# Patient Record
Sex: Female | Born: 1990
Health system: Southern US, Community
[De-identification: ages and names within clinical notes are randomized; demographics above are authoritative.]

## PROBLEM LIST (undated history)

## (undated) ENCOUNTER — Inpatient Hospital Stay (HOSPITAL_COMMUNITY): Payer: Self-pay

## (undated) HISTORY — PX: TONSILLECTOMY: SUR1361

## (undated) HISTORY — PX: KNEE SURGERY: SHX244

## (undated) HISTORY — PX: KNEE ARTHROSCOPY: SHX127

---

## 2011-09-10 ENCOUNTER — Ambulatory Visit: Payer: Self-pay | Admitting: Family Medicine

## 2011-09-14 ENCOUNTER — Ambulatory Visit: Payer: Self-pay | Admitting: Family Medicine

## 2013-03-17 ENCOUNTER — Encounter (HOSPITAL_COMMUNITY): Payer: Self-pay

## 2013-03-17 ENCOUNTER — Inpatient Hospital Stay (HOSPITAL_COMMUNITY)
Admission: AD | Admit: 2013-03-17 | Discharge: 2013-03-17 | Disposition: A | Discharge status: Home / Self Care | Payer: Medicaid Other | Source: Ambulatory Visit | Attending: Family Medicine | Admitting: Family Medicine

## 2013-03-17 DIAGNOSIS — R109 Unspecified abdominal pain: Secondary | ICD-10-CM | POA: Insufficient documentation

## 2013-03-17 DIAGNOSIS — O219 Vomiting of pregnancy, unspecified: Secondary | ICD-10-CM

## 2013-03-17 DIAGNOSIS — N831 Corpus luteum cyst of ovary, unspecified side: Secondary | ICD-10-CM | POA: Insufficient documentation

## 2013-03-17 DIAGNOSIS — O210 Mild hyperemesis gravidarum: Secondary | ICD-10-CM | POA: Insufficient documentation

## 2013-03-17 DIAGNOSIS — O34599 Maternal care for other abnormalities of gravid uterus, unspecified trimester: Secondary | ICD-10-CM | POA: Insufficient documentation

## 2013-03-17 LAB — URINE MICROSCOPIC-ADD ON

## 2013-03-17 LAB — COMPREHENSIVE METABOLIC PANEL
AST: 14 U/L (ref 0–37)
Albumin: 4.2 g/dL (ref 3.5–5.2)
CO2: 20 mEq/L (ref 19–32)
Calcium: 9.9 mg/dL (ref 8.4–10.5)
Creatinine, Ser: 0.5 mg/dL (ref 0.50–1.10)
GFR calc non Af Amer: >90 mL/min (ref >90)
Total Protein: 7.3 g/dL (ref 6.0–8.3)

## 2013-03-17 LAB — URINALYSIS, ROUTINE W REFLEX MICROSCOPIC
Glucose, UA: NEGATIVE mg/dL
Leukocytes, UA: NEGATIVE
Specific Gravity, Urine: >1.03 — ABNORMAL HIGH (ref 1.005–1.030)
pH: 6 (ref 5.0–8.0)

## 2013-03-17 MED ORDER — PROMETHAZINE HCL 25 MG PO TABS: 25.0000 mg | ORAL_TABLET | Freq: Four times a day (QID) | ORAL | Status: DC | PRN

## 2013-03-17 MED ORDER — PROMETHAZINE HCL 25 MG/ML IJ SOLN
25.0000 mg | Freq: Once | INTRAVENOUS | Status: AC
  Administered 2013-03-17: 25 mg via INTRAVENOUS
  Filled 2013-03-17: qty 1

## 2013-03-17 MED ORDER — DEXTROSE 5 % IN LACTATED RINGERS IV BOLUS
1000.0000 mL | Freq: Once | INTRAVENOUS | Status: AC
  Administered 2013-03-17: 1000 mL via INTRAVENOUS

## 2013-03-17 NOTE — MAU Provider Note (Signed)
History     CSN: [id number]  Arrival date and time: 03/17/13 4098   First Provider Initiated Contact with Patient 03/17/13 1010      Chief Complaint  Patient presents with  . Emesis During Pregnancy  . Abdominal Pain   HPI Vicki Hull is a 22 y.o. G3P2002 at [redacted]w[redacted]d who presents to MAU today with complaint of N/V and abdominal pain. The patient was seen at [facility] yesterday and given Zofran and IV fluids. She presents lab results that also show low K+ that was replaced. The patient has [place] results as well that show IUP at [redacted]w[redacted]d with cardiac activity and a corpus luteum cyst. The patient states that she tried the Zofran this morning and still wasn't able to eat. She has had N/V x 2 weeks. She reports slight headache and mild mid-abdominal pain. She denies vaginal bleeding, discharge or UTI symptoms. She states that she had fever last Saturday of 102.57F that has resolved without medication. The patient plans to go to [facility] in [place] for prenatal care, but has not yet been seen for this pregnancy.   OB History   Grav Para Term Preterm Abortions TAB SAB Ect Mult Living   3 2 2  0 0 0 0 0 0 2      No past medical history on file.  No past surgical history on file.  No family history on file.  History  Substance Use Topics  . Smoking status: Not on file  . Smokeless tobacco: Not on file  . Alcohol Use: Not on file    Allergies: No Known Allergies  No prescriptions prior to admission    Review of Systems  Constitutional: Negative for fever and malaise/fatigue.  Gastrointestinal: Positive for nausea, vomiting, abdominal pain and diarrhea. Negative for constipation.  Genitourinary: Negative for dysuria, urgency and frequency.       Neg - vaginal bleeding, discharge  Neurological: Negative for dizziness.   Physical Exam   Blood pressure 118/64, pulse 69, temperature 98.4 F (36.9 C), temperature source Oral, resp. rate 16, height 5\' 2"  (1.575 m), weight 159 lb 3.2 oz  (72.213 kg), SpO2 99.00%.  Physical Exam  Constitutional: She is oriented to person, place, and time. She appears well-developed and well-nourished. No distress.  HENT:  Head: Normocephalic and atraumatic.  Cardiovascular: Normal rate, regular rhythm and normal heart sounds.   Respiratory: Effort normal and breath sounds normal. No respiratory distress.  GI: Soft. Bowel sounds are normal. She exhibits no distension and no mass. There is tenderness. There is no guarding. Rebound: mild tenderness to palpation of the upper abdomen and LLQ.  Neurological: She is alert and oriented to person, place, and time.  Skin: Skin is warm and dry.  Psychiatric: She has a normal mood and affect.   Results for orders placed during the hospital encounter of 03/17/13 (from the past 24 hour(s))  URINALYSIS, ROUTINE W REFLEX MICROSCOPIC     Status: Abnormal   Collection Time    03/17/13  9:50 AM      Result Value Range   Color, Urine YELLOW  YELLOW   APPearance CLOUDY (*) CLEAR   Specific Gravity, Urine >1.030 (*) 1.005 - 1.030   pH 6.0  5.0 - 8.0   Glucose, UA NEGATIVE  NEGATIVE mg/dL   Hgb urine dipstick TRACE (*) NEGATIVE   Bilirubin Urine NEGATIVE  NEGATIVE   Ketones, ur >80 (*) NEGATIVE mg/dL   Protein, ur NEGATIVE  NEGATIVE mg/dL   Urobilinogen,  UA 0.2  0.0 - 1.0 mg/dL   Nitrite NEGATIVE  NEGATIVE   Leukocytes, UA NEGATIVE  NEGATIVE  URINE MICROSCOPIC-ADD ON     Status: Abnormal   Collection Time    03/17/13  9:50 AM      Result Value Range   Squamous Epithelial / LPF MANY (*) RARE   WBC, UA 0-2  <3 WBC/hpf   Bacteria, UA FEW (*) RARE   Urine-Other MUCOUS PRESENT    COMPREHENSIVE METABOLIC PANEL     Status: Abnormal   Collection Time    03/17/13 11:00 AM      Result Value Range   Sodium 133 (*) 135 - 145 mEq/L   Potassium 3.7  3.5 - 5.1 mEq/L   Chloride 98  96 - 112 mEq/L   CO2 20  19 - 32 mEq/L   Glucose, Bld 69 (*) 70 - 99 mg/dL   BUN 6  6 - 23 mg/dL   Creatinine, Ser 9.60  0.50 -  1.10 mg/dL   Calcium 9.9  8.4 - 45.4 mg/dL   Total Protein 7.3  6.0 - 8.3 g/dL   Albumin 4.2  3.5 - 5.2 g/dL   AST 14  0 - 37 U/L   ALT 8  0 - 35 U/L   Alkaline Phosphatase 46  39 - 117 U/L   Total Bilirubin 0.5  0.3 - 1.2 mg/dL   GFR calc non Af Amer >90  >90 mL/min   GFR calc Af Amer >90  >90 mL/min    MAU Course  Procedures None  MDM 2 L IV fluid, first liter LR with phenergan infusion Patient reports improvement in symptoms. Albe to take in PO while in MAU without emesis  Assessment and Plan  A: Nausea and vomiting in pregnancy prior to [redacted] weeks gestation  P: Discharge home Rx for phenergan tabs sent to patient's pharmacy. Patient instructed that these can be used PO or vaginally Patient encouraged to follow-up with [facility] Patient may return to MAU as needed or if her condition were to change or worsen  Freddi Starr, PA-C  03/17/2013, 4:58 PM

## 2013-03-17 NOTE — MAU Provider Note (Signed)
Chart reviewed and agree with management and plan.  

## 2013-03-17 NOTE — MAU Note (Signed)
Patient states she has had nausea and vomiting for about 2 weeks. Was seen at [facility] yesterday and was given IV fluids. States she continues to having vomiting. Zofran given yesterday is not working. Denies bleeding. Having mid abdominal pain.

## 2013-03-17 NOTE — MAU Note (Signed)
Patient presents to MAU with c/o N/V and sharp R abdominal pain since yesterday. Reports she was seen at [facility] for N/V and given fluids, K+, and Zofran by IV.  Reports she was discharged with Zofran but has been unable to keep it down.

## 2013-07-24 ENCOUNTER — Other Ambulatory Visit (HOSPITAL_COMMUNITY): Payer: Self-pay | Admitting: Specialist

## 2013-07-24 DIAGNOSIS — O358XX0 Maternal care for other (suspected) fetal abnormality and damage, not applicable or unspecified: Secondary | ICD-10-CM

## 2013-08-02 ENCOUNTER — Ambulatory Visit (HOSPITAL_COMMUNITY): Payer: Medicaid Other

## 2013-08-08 ENCOUNTER — Ambulatory Visit (HOSPITAL_COMMUNITY): Payer: Medicaid Other

## 2013-08-18 ENCOUNTER — Encounter (HOSPITAL_COMMUNITY): Payer: Self-pay

## 2013-08-18 ENCOUNTER — Ambulatory Visit (HOSPITAL_COMMUNITY)
Admission: RE | Admit: 2013-08-18 | Discharge: 2013-08-18 | Disposition: A | Discharge status: Home / Self Care | Payer: Medicaid Other | Source: Ambulatory Visit | Attending: Specialist | Admitting: Specialist

## 2013-08-18 ENCOUNTER — Other Ambulatory Visit: Payer: Self-pay

## 2013-08-18 DIAGNOSIS — Z1389 Encounter for screening for other disorder: Secondary | ICD-10-CM | POA: Insufficient documentation

## 2013-08-18 DIAGNOSIS — Z363 Encounter for antenatal screening for malformations: Secondary | ICD-10-CM | POA: Insufficient documentation

## 2013-08-18 DIAGNOSIS — O358XX0 Maternal care for other (suspected) fetal abnormality and damage, not applicable or unspecified: Secondary | ICD-10-CM | POA: Insufficient documentation

## 2013-08-18 DIAGNOSIS — O352XX0 Maternal care for (suspected) hereditary disease in fetus, not applicable or unspecified: Secondary | ICD-10-CM | POA: Insufficient documentation

## 2014-01-20 ENCOUNTER — Encounter (HOSPITAL_COMMUNITY): Payer: Self-pay | Admitting: *Deleted

## 2014-08-13 ENCOUNTER — Encounter (HOSPITAL_COMMUNITY): Payer: Self-pay | Admitting: *Deleted

## 2015-02-06 IMAGING — US US OB DETAIL+14 WK
1 series · 12 of 28 positions shown · non-contrast
Comparison: none

[Series 1: us ob detail+14 wk · 0.26mm/px · 12 of 79 slices shown]
[im 3/79]
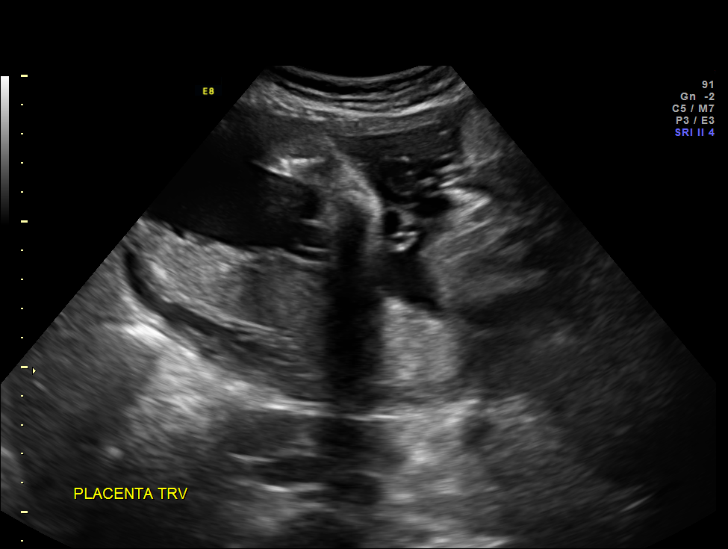
[im 9/79]
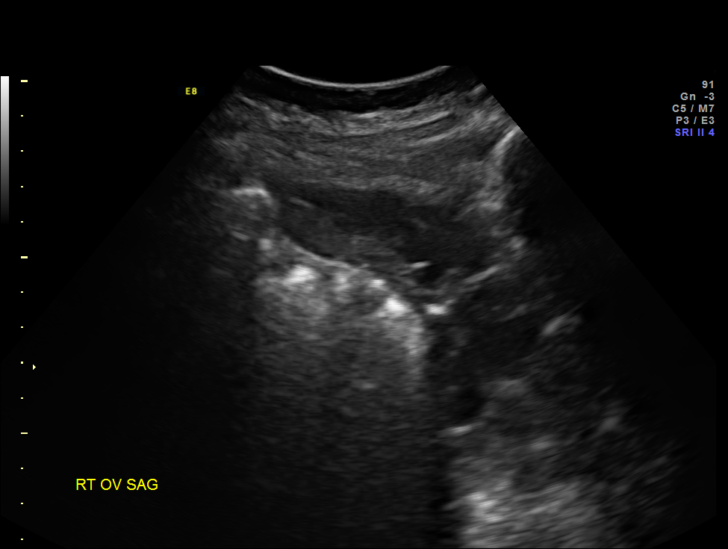
[im 15/79]
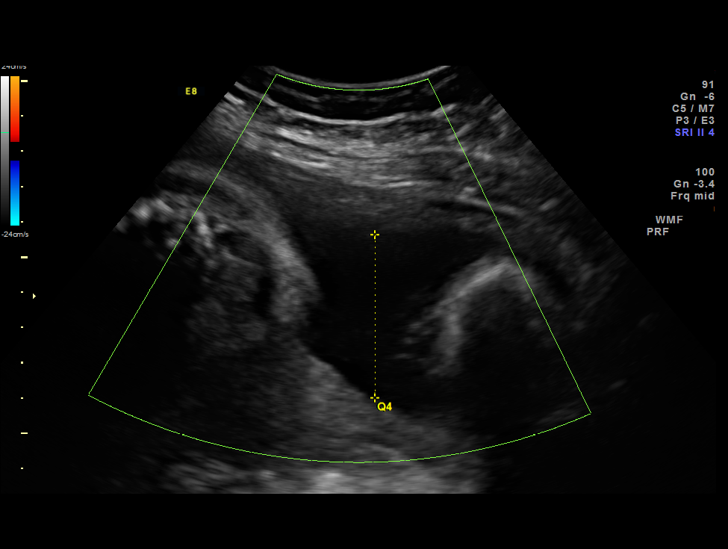
[im 24/79]
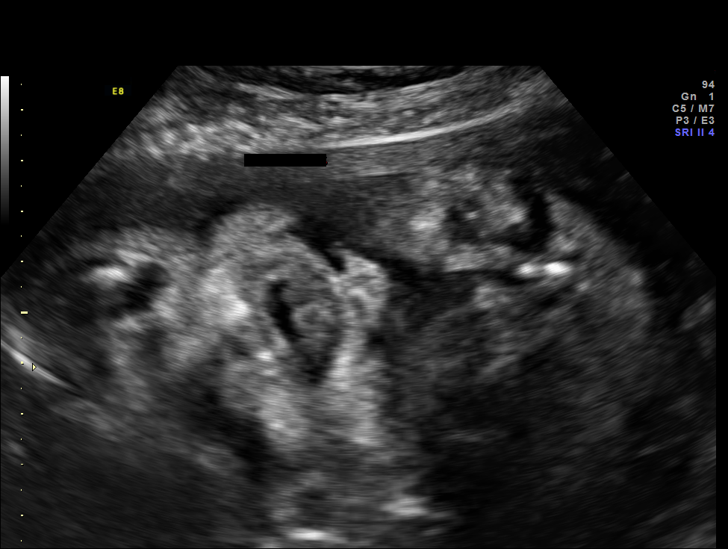
[im 29/79]
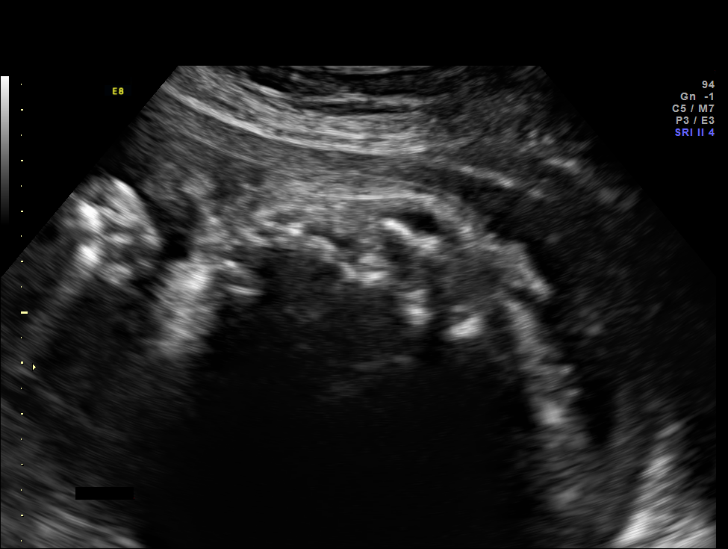
[im 35/79]
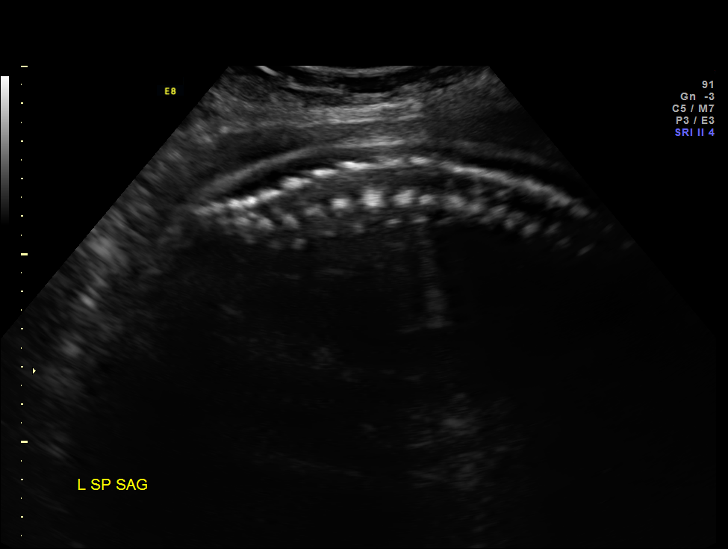
[im 44/79]
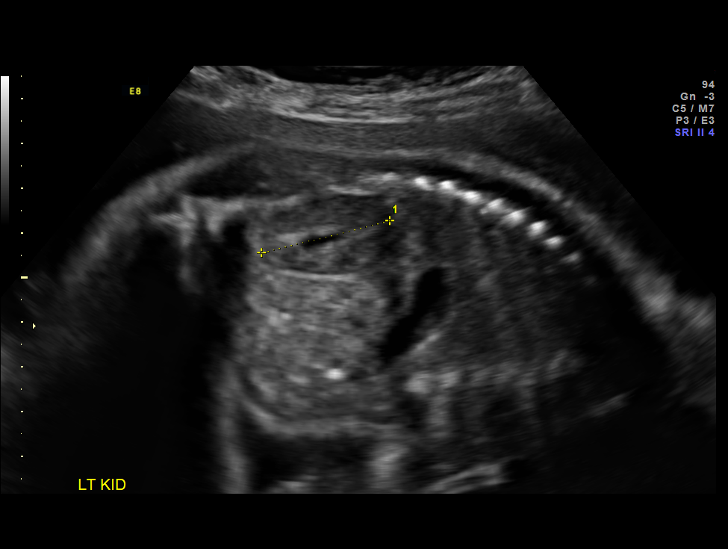
[im 50/79]
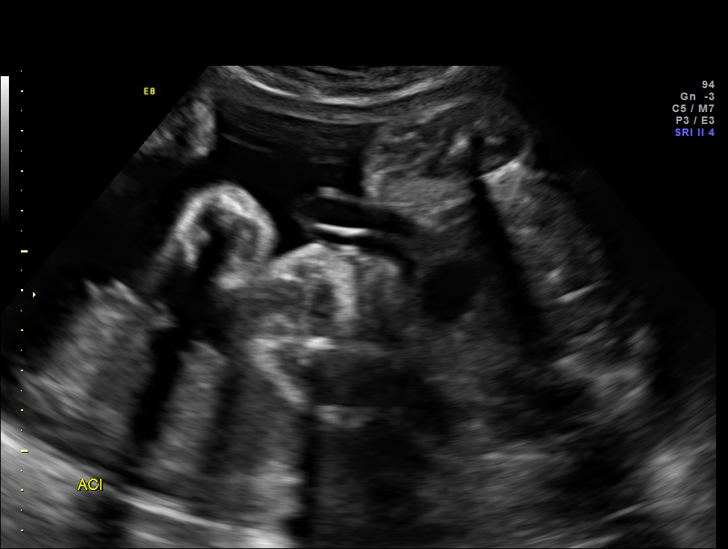
[im 55/79]
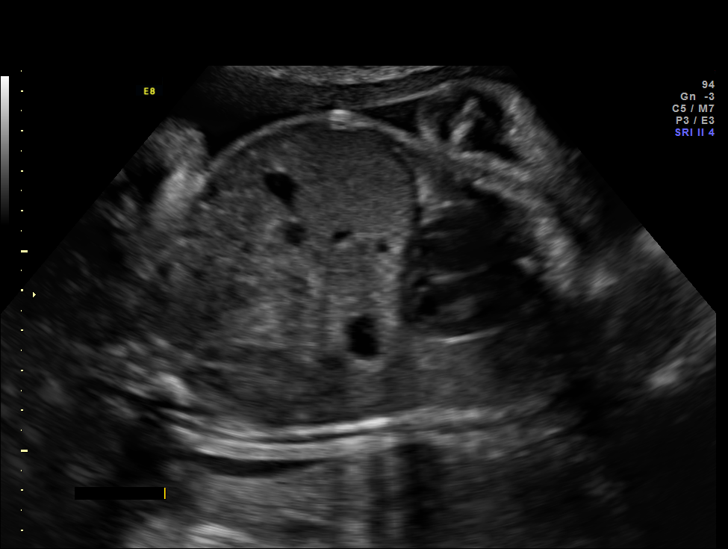
[im 64/79]
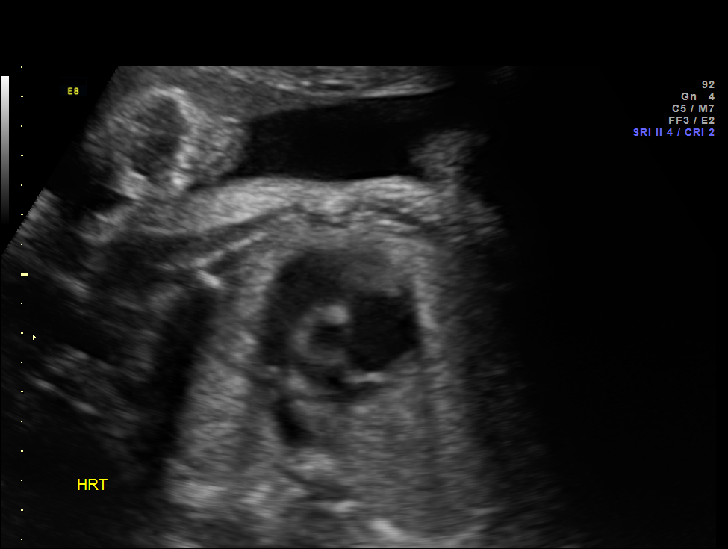
[im 70/79]
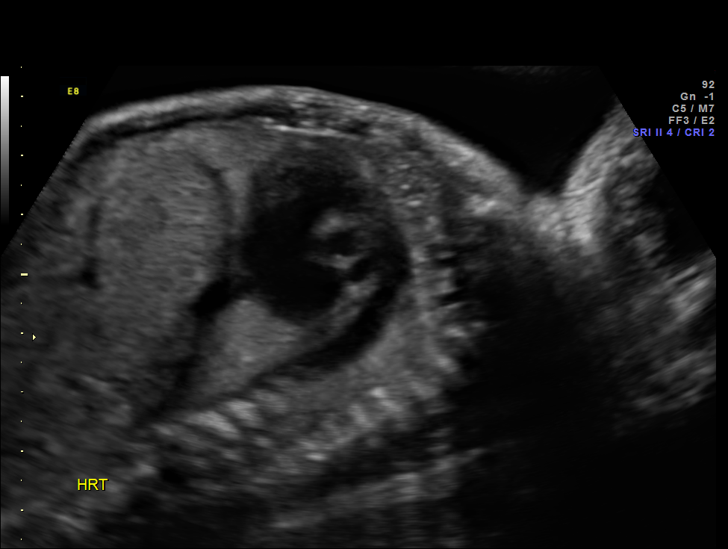
[im 76/79]
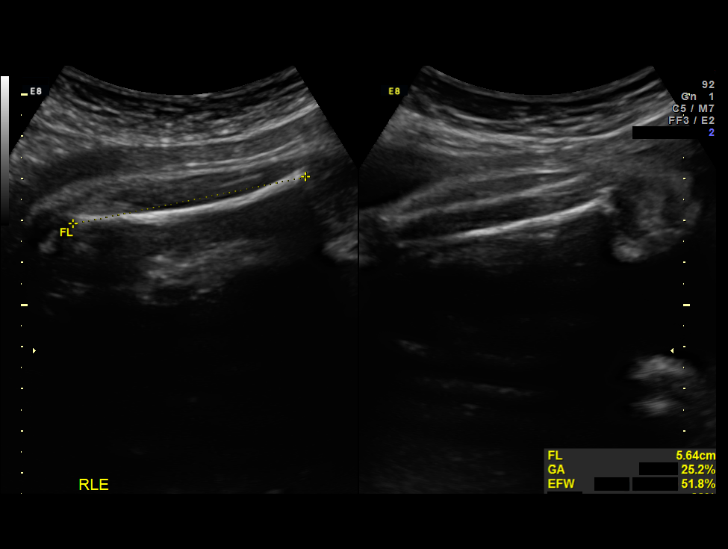

[12 of 28 positions shown; findings below may reference images not displayed]

OBSTETRICS REPORT
                      (Signed Final 08/18/2013 [DATE])

Service(s) Provided

 US OB DETAIL + 14 WK                                  76811.0
Indications

 Detailed fetal anatomic survey
 History of genetic / anatomic abnormality - cleft
 lip/palate (pt's son)
Fetal Evaluation

 Num Of Fetuses:    1
 Fetal Heart Rate:  166                          bpm
 Cardiac Activity:  Observed
 Presentation:      Cephalic
 Placenta:          Posterior
 P. Cord            Visualized
 Insertion:

 Amniotic Fluid
 AFI FV:      Subjectively within normal limits
 AFI Sum:     16.38   cm       60  %Tile     Larg Pckt:    4.63  cm
 RUQ:   3.95    cm   RLQ:    4.63   cm    LUQ:   4.5     cm   LLQ:    3.3    cm
Biometry

 BPD:     75.9  mm     G. Age:  30w 3d                CI:         75.1   70 - 86
 OFD:    101.1  mm                                    FL/HC:      20.5   19.2 -

 HC:     282.7  mm     G. Age:  31w 0d       44  %    HC/AC:      1.10   0.99 -

 AC:       257  mm     G. Age:  29w 6d       41  %    FL/BPD:     76.3   71 - 87
 FL:      57.9  mm     G. Age:  30w 2d       44  %    FL/AC:      22.5   20 - 24
 HUM:     51.4  mm     G. Age:  30w 0d       54  %
 CER:     37.1  mm     G. Age:  32w 0d       74  %

 Est. FW:    1422  gm      3 lb 6 oz     56  %
Gestational Age

 LMP:           31w 3d        Date:  01/10/13                 EDD:   10/17/13
 U/S Today:     30w 3d                                        EDD:   10/24/13
 Best:          30w 0d     Det. By:  Early Ultrasound         EDD:   10/27/13
Anatomy

 Cranium:          Appears normal         Aortic Arch:      Appears normal
 Fetal Cavum:      Appears normal         Ductal Arch:      Appears normal
 Ventricles:       Appears normal         Diaphragm:        Appears normal
 Choroid Plexus:   Appears normal         Stomach:          Appears normal
 Cerebellum:       Appears normal         Abdomen:          Appears normal
 Posterior Fossa:  Appears normal         Abdominal Wall:   Appears nml (cord
                                                            insert, abd wall)
 Nuchal Fold:      Not applicable (>20    Cord Vessels:     Appears normal (3
                   wks GA)                                  vessel cord)
 Face:             Appears normal         Kidneys:          Appear normal
                   (orbits and profile)
 Lips:             Appears normal         Bladder:          Appears normal
 Palate:           Appears normal         Spine:            Appears normal
 Heart:            Appears normal         Lower             Appears normal
                   (4CH, axis, and        Extremities:
                   situs)
 RVOT:             Appears normal         Upper             Appears normal
                                          Extremities:
 LVOT:             Appears normal

 Other:  Fetus appears to be a female. Heels visualized. Nasal bone
         visualized.
Targeted Anatomy

 Fetal Central Nervous System
 Cisterna Magna:
Cervix Uterus Adnexa

 Cervix:       Not visualized (advanced GA >67wks)
 Left Ovary:    Within normal limits.
 Right Ovary:   Within normal limits.
 Adnexa:     No abnormality visualized.
Impression

 Active SIUP at 84wk4d
 EFW 56th percentile
 No dysmorphic features
 AFI is gestational age appropriate
Recommendations

 Follow up ultrasounds as clinically indicated.

 questions or concerns.

## 2016-07-01 DIAGNOSIS — R87612 Low grade squamous intraepithelial lesion on cytologic smear of cervix (LGSIL): Secondary | ICD-10-CM | POA: Insufficient documentation

## 2016-07-01 DIAGNOSIS — R87810 Cervical high risk human papillomavirus (HPV) DNA test positive: Secondary | ICD-10-CM | POA: Insufficient documentation

## 2018-08-03 ENCOUNTER — Encounter (HOSPITAL_BASED_OUTPATIENT_CLINIC_OR_DEPARTMENT_OTHER): Payer: Self-pay

## 2018-08-03 ENCOUNTER — Other Ambulatory Visit: Payer: Self-pay

## 2018-08-03 ENCOUNTER — Emergency Department (HOSPITAL_BASED_OUTPATIENT_CLINIC_OR_DEPARTMENT_OTHER)
Admission: EM | Admit: 2018-08-03 | Discharge: 2018-08-03 | Disposition: A | Discharge status: Home / Self Care | Payer: BLUE CROSS/BLUE SHIELD | Attending: Emergency Medicine | Admitting: Emergency Medicine

## 2018-08-03 DIAGNOSIS — F17210 Nicotine dependence, cigarettes, uncomplicated: Secondary | ICD-10-CM | POA: Diagnosis not present

## 2018-08-03 DIAGNOSIS — M545 Low back pain: Secondary | ICD-10-CM | POA: Insufficient documentation

## 2018-08-03 DIAGNOSIS — M25512 Pain in left shoulder: Secondary | ICD-10-CM | POA: Insufficient documentation

## 2018-08-03 DIAGNOSIS — Z79899 Other long term (current) drug therapy: Secondary | ICD-10-CM | POA: Insufficient documentation

## 2018-08-03 MED ORDER — IBUPROFEN 600 MG PO TABS: 600.0000 mg | ORAL_TABLET | Freq: Four times a day (QID) | ORAL | 0 refills | Status: DC | PRN

## 2018-08-03 MED ORDER — CYCLOBENZAPRINE HCL 10 MG PO TABS: 10.0000 mg | ORAL_TABLET | Freq: Two times a day (BID) | ORAL | 0 refills | Status: DC | PRN
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
## 2018-08-03 NOTE — ED Notes (Signed)
Pt verbalizes understanding of d/c instructions and denies any further needs at this time. 

## 2018-11-29 ENCOUNTER — Ambulatory Visit: Payer: Medicaid Other | Admitting: Medical

## 2019-02-04 ENCOUNTER — Emergency Department (HOSPITAL_BASED_OUTPATIENT_CLINIC_OR_DEPARTMENT_OTHER)
Admission: EM | Admit: 2019-02-04 | Discharge: 2019-02-04 | Disposition: A | Discharge status: Home / Self Care | Payer: Medicaid Other | Attending: Emergency Medicine | Admitting: Emergency Medicine

## 2019-02-04 ENCOUNTER — Encounter (HOSPITAL_BASED_OUTPATIENT_CLINIC_OR_DEPARTMENT_OTHER): Payer: Self-pay | Admitting: Emergency Medicine

## 2019-02-04 ENCOUNTER — Other Ambulatory Visit: Payer: Self-pay

## 2019-02-04 DIAGNOSIS — Z79899 Other long term (current) drug therapy: Secondary | ICD-10-CM | POA: Insufficient documentation

## 2019-02-04 DIAGNOSIS — F17210 Nicotine dependence, cigarettes, uncomplicated: Secondary | ICD-10-CM | POA: Insufficient documentation

## 2019-02-04 DIAGNOSIS — O99331 Smoking (tobacco) complicating pregnancy, first trimester: Secondary | ICD-10-CM | POA: Insufficient documentation

## 2019-02-04 DIAGNOSIS — E860 Dehydration: Secondary | ICD-10-CM

## 2019-02-04 DIAGNOSIS — Z3A01 Less than 8 weeks gestation of pregnancy: Secondary | ICD-10-CM | POA: Insufficient documentation

## 2019-02-04 DIAGNOSIS — O99281 Endocrine, nutritional and metabolic diseases complicating pregnancy, first trimester: Secondary | ICD-10-CM | POA: Insufficient documentation

## 2019-02-04 DIAGNOSIS — R102 Pelvic and perineal pain: Secondary | ICD-10-CM | POA: Insufficient documentation

## 2019-02-04 DIAGNOSIS — Z3491 Encounter for supervision of normal pregnancy, unspecified, first trimester: Secondary | ICD-10-CM

## 2019-02-04 DIAGNOSIS — O219 Vomiting of pregnancy, unspecified: Secondary | ICD-10-CM | POA: Insufficient documentation

## 2019-02-04 DIAGNOSIS — O210 Mild hyperemesis gravidarum: Secondary | ICD-10-CM

## 2019-02-04 LAB — COMPREHENSIVE METABOLIC PANEL
ALT: 13 U/L (ref 0–44)
AST: 15 U/L (ref 15–41)
Albumin: 4.1 g/dL (ref 3.5–5.0)
Alkaline Phosphatase: 53 U/L (ref 38–126)
Anion gap: 10 (ref 5–15)
BUN: 6 mg/dL (ref 6–20)
CO2: 18 mmol/L — ABNORMAL LOW (ref 22–32)
Calcium: 9.1 mg/dL (ref 8.9–10.3)
Chloride: 104 mmol/L (ref 98–111)
Creatinine, Ser: 0.55 mg/dL (ref 0.44–1.00)
GFR calc Af Amer: >60 mL/min (ref >60)
GFR calc non Af Amer: >60 mL/min (ref >60)
Glucose, Bld: 97 mg/dL (ref 70–99)
Potassium: 3 mmol/L — ABNORMAL LOW (ref 3.5–5.1)
Sodium: 132 mmol/L — ABNORMAL LOW (ref 135–145)
Total Bilirubin: 0.5 mg/dL (ref 0.3–1.2)
Total Protein: 7.5 g/dL (ref 6.5–8.1)

## 2019-02-04 LAB — CBC WITH DIFFERENTIAL/PLATELET
Abs Immature Granulocytes: 0.04 10*3/uL (ref 0.00–0.07)
Basophils Absolute: 0 10*3/uL (ref 0.0–0.1)
Basophils Relative: 1 %
Eosinophils Absolute: 0.1 10*3/uL (ref 0.0–0.5)
Eosinophils Relative: 1 %
HCT: 39.3 % (ref 36.0–46.0)
Hemoglobin: 13.7 g/dL (ref 12.0–15.0)
Immature Granulocytes: 1 %
Lymphocytes Relative: 35 %
Lymphs Abs: 2.6 10*3/uL (ref 0.7–4.0)
MCH: 32.5 pg (ref 26.0–34.0)
MCHC: 34.9 g/dL (ref 30.0–36.0)
MCV: 93.3 fL (ref 80.0–100.0)
Monocytes Absolute: 0.6 10*3/uL (ref 0.1–1.0)
Monocytes Relative: 8 %
Neutro Abs: 4 10*3/uL (ref 1.7–7.7)
Neutrophils Relative %: 54 %
Platelets: 278 10*3/uL (ref 150–400)
RBC: 4.21 MIL/uL (ref 3.87–5.11)
RDW: 11.9 % (ref 11.5–15.5)
WBC: 7.3 10*3/uL (ref 4.0–10.5)
nRBC: 0 % (ref 0.0–0.2)

## 2019-02-04 LAB — URINALYSIS, ROUTINE W REFLEX MICROSCOPIC
Glucose, UA: NEGATIVE mg/dL
Ketones, ur: >80 mg/dL — AB
Leukocytes,Ua: NEGATIVE
Nitrite: NEGATIVE
Protein, ur: 30 mg/dL — AB
Specific Gravity, Urine: >1.03 — ABNORMAL HIGH (ref 1.005–1.030)
pH: 6 (ref 5.0–8.0)

## 2019-02-04 LAB — URINALYSIS, MICROSCOPIC (REFLEX)

## 2019-02-04 LAB — LIPASE, BLOOD: Lipase: 21 U/L (ref 11–51)

## 2019-02-04 LAB — PREGNANCY, URINE: Preg Test, Ur: POSITIVE — AB

## 2019-02-04 MED ORDER — METOCLOPRAMIDE HCL 10 MG PO TABS: 10.0000 mg | ORAL_TABLET | Freq: Four times a day (QID) | ORAL | 0 refills | Status: DC

## 2019-02-04 MED ORDER — ONDANSETRON HCL 4 MG/2ML IJ SOLN
4.0000 mg | Freq: Once | INTRAMUSCULAR | Status: AC
  Administered 2019-02-04: 14:00:00 4 mg via INTRAVENOUS
  Filled 2019-02-04: qty 2

## 2019-02-04 MED ORDER — POTASSIUM CHLORIDE CRYS ER 20 MEQ PO TBCR: 20.0000 meq | EXTENDED_RELEASE_TABLET | Freq: Two times a day (BID) | ORAL | 0 refills | Status: DC

## 2019-02-04 MED ORDER — LACTATED RINGERS IV BOLUS
1000.0000 mL | Freq: Once | INTRAVENOUS | Status: AC
  Administered 2019-02-04: 15:00:00 1000 mL via INTRAVENOUS

## 2019-02-04 MED ORDER — POTASSIUM CHLORIDE CRYS ER 20 MEQ PO TBCR
40.0000 meq | EXTENDED_RELEASE_TABLET | Freq: Once | ORAL | Status: AC
  Administered 2019-02-04: 16:00:00 40 meq via ORAL
  Filled 2019-02-04: qty 2

## 2019-02-04 MED ORDER — SODIUM CHLORIDE 0.9 % IV BOLUS
1000.0000 mL | Freq: Once | INTRAVENOUS | Status: AC
  Administered 2019-02-04: 1000 mL via INTRAVENOUS

## 2019-02-04 MED FILL — METOCLOPRAMIDE 10 MG TABLET: 10 | 5 days supply | Qty: 20 | Fill #0

## 2019-02-04 NOTE — ED Triage Notes (Signed)
Pt just found out she is pregnant. C/o N/v. Has an appt next week for an abortion but needs some medication for nausea until then.

## 2019-02-04 NOTE — ED Provider Notes (Signed)
Patient has tolerated oral fluids and po potassium without difficulty.  Finishing up fluids.  She will be discharged home with potassium prescription and Reglan.  Return precautions discussed.   Pricilla Loveless, MD 02/04/19 [phone]

## 2019-02-04 NOTE — ED Notes (Signed)
Tolerating po fluids. No emesis

## 2019-02-04 NOTE — ED Provider Notes (Signed)
[facility] EMERGENCY DEPARTMENT Provider Note   CSN: [id number] Arrival date & time: 02/04/19  1314    History   Chief Complaint Chief Complaint  Patient presents with  . Emesis During Pregnancy    HPI Vicki Hull is a 28 y.o. female.     Pt is a 27y/o [id number] presenting with 1 week of n/v and occassional abd cramps.  Patient states that approximately Friday and Saturday of last week which is 7 days prior to arrival she had some dysuria and frequency and urgency.  That went away but she started having vomiting on Sunday.  It has persisted throughout the day for the last week.  Anytime she tries to eat or drink she will vomit and has ongoing nausea.  She has had occasional vaginal spotting but no frank blood or diarrhea.  Patient did take a urine pregnancy test last week and it was positive.  Last normal menses was on 12/20/2018.  Patient has also felt hot and cold chills but has not checked her temperature.  She is also complaining of some mild low back pain.  No prior history of ectopic pregnancies and she had 3 uncomplicated vaginal deliveries in the past.  Patient states she is not planning on keeping this pregnancy and has an appointment on Tuesday but the nausea and vomiting was more than she can handle.  She denies any cough, congestion or shortness of breath.  She has no upper abdominal pain.  She is not currently having abdominal pain.  The history is provided by the patient.    History reviewed. No pertinent past medical history.  There are no active problems to display for this patient.   Past Surgical History:  Procedure Laterality Date  . KNEE SURGERY    . TONSILLECTOMY       OB History    Gravida  4   Para  2   Term  2   Preterm  0   AB  0   Living  2     SAB  0   TAB  0   Ectopic  0   Multiple  0   Live Births  2            Home Medications    Prior to Admission medications   Medication Sig Start Date End Date Taking?  Authorizing Provider  cyclobenzaprine (FLEXERIL) 10 MG tablet Take 1 tablet (10 mg total) by mouth 2 (two) times daily as needed for muscle spasms. 08/03/18   Fayrene Helper, PA-C  ibuprofen (ADVIL,MOTRIN) 600 MG tablet Take 1 tablet (600 mg total) by mouth every 6 (six) hours as needed. 08/03/18   Fayrene Helper, PA-C  ondansetron (ZOFRAN) 4 MG tablet Take 4 mg by mouth every 8 (eight) hours as needed for nausea.    [provider]  Prenatal Vit-Fe Fumarate-FA (PRENATAL MULTIVITAMIN) TABS Take 1 tablet by mouth daily at 12 noon.    [provider]  promethazine (PHENERGAN) 25 MG tablet Take 1 tablet (25 mg total) by mouth every 6 (six) hours as needed for nausea. 03/17/13   Marny Lowenstein, PA-C    Family History No family history on file.  Social History Social History   Tobacco Use  . Smoking status: Current Every Day Smoker    Types: Cigarettes  . Smokeless tobacco: Never Used  Substance Use Topics  . Alcohol use: Never    Frequency: Never  . Drug use: Not on file  Allergies   Patient has no known allergies.   Review of Systems Review of Systems  All other systems reviewed and are negative.    Physical Exam Updated Vital Signs BP 128/76 (BP Location: Left Arm)   Pulse 76   Temp 98.6 F (37 C) (Oral)   Resp 18   Ht 5\' 4"  (1.626 m)   Wt 90.7 kg   LMP 12/20/2018   SpO2 99%   BMI 34.33 kg/m   Physical Exam Vitals signs and nursing note reviewed.  Constitutional:      General: She is not in acute distress.    Appearance: She is well-developed.  HENT:     Head: Normocephalic and atraumatic.  Eyes:     Pupils: Pupils are equal, round, and reactive to light.  Cardiovascular:     Rate and Rhythm: Normal rate and regular rhythm.     Heart sounds: Normal heart sounds. No murmur. No friction rub.  Pulmonary:     Effort: Pulmonary effort is normal.     Breath sounds: Normal breath sounds. No wheezing or rales.  Abdominal:     General: Bowel sounds  are normal. There is no distension.     Palpations: Abdomen is soft.     Tenderness: There is abdominal tenderness. There is right CVA tenderness. There is no left CVA tenderness, guarding or rebound.     Comments: Mild suprapubic tenderness  Musculoskeletal: Normal range of motion.        General: No tenderness.     Comments: No edema  Skin:    General: Skin is warm and dry.     Findings: No rash.  Neurological:     General: No focal deficit present.     Mental Status: She is alert and oriented to person, place, and time. Mental status is at baseline.     Cranial Nerves: No cranial nerve deficit.  Psychiatric:        Mood and Affect: Mood normal.        Behavior: Behavior normal.        Thought Content: Thought content normal.      ED Treatments / Results  Labs (all labs ordered are listed, but only abnormal results are displayed) Labs Reviewed  COMPREHENSIVE METABOLIC PANEL - Abnormal; Notable for the following components:      Result Value   Sodium 132 (*)    Potassium 3.0 (*)    CO2 18 (*)    All other components within normal limits  PREGNANCY, URINE - Abnormal; Notable for the following components:   Preg Test, Ur POSITIVE (*)    All other components within normal limits  URINALYSIS, ROUTINE W REFLEX MICROSCOPIC - Abnormal; Notable for the following components:   APPearance CLOUDY (*)    Specific Gravity, Urine >1.030 (*)    Hgb urine dipstick MODERATE (*)    Bilirubin Urine SMALL (*)    Ketones, ur >80 (*)    Protein, ur 30 (*)    All other components within normal limits  URINALYSIS, MICROSCOPIC (REFLEX) - Abnormal; Notable for the following components:   Bacteria, UA MANY (*)    All other components within normal limits  CBC WITH DIFFERENTIAL/PLATELET  LIPASE, BLOOD    EKG None  Radiology No results found.  Procedures Procedures (including critical care time) EMERGENCY DEPARTMENT [place] PREGNANCY "Study: Limited Ultrasound of the Pelvis for Pregnancy"   INDICATIONS:Pregnancy(required) Multiple views of the uterus and pelvic cavity were obtained in real-time with a multi-frequency probe.  APPROACH:Transabdominal  PERFORMED BY: Myself IMAGES ARCHIVED?: Yes LIMITATIONS: none PREGNANCY FREE FLUID: None ADNEXAL FINDINGS:bilateral ovaries seen without cyst  GESTATIONAL AGE, ESTIMATE: 4852w6d FETAL HEART RATE: flicker  INTERPRETATION: Intrauterine gestational sac noted, Yolk sac noted and Fetal pole present     Medications Ordered in ED Medications  sodium chloride 0.9 % bolus 1,000 mL (has no administration in time range)  ondansetron (ZOFRAN) injection 4 mg (has no administration in time range)     Initial Impression / Assessment and Plan / ED Course  I have reviewed the triage vital signs and the nursing notes.  Pertinent labs & imaging results that were available during my care of the patient were reviewed by me and considered in my medical decision making (see chart for details).        Patient is a 28 year old female presenting today with persistent vomiting for the last 1 week which was preceded by a few days of dysuria.  Patient has some mild flank pain mostly on the right as well.  She had occasional vaginal spotting but no frank menses.  Patient had a home pregnancy test that was positive.  On ultrasound at bedside today she has a gestational sac with flicker over the fetal pole at 7 weeks 6 days.  Patient states she does not desire to keep this pregnancy and has an appointment on Tuesday for termination.  However she is requesting medication for the nausea and vomiting.  This may all be pregnancy induced nausea and vomiting however will ensure she does not have pyelonephritis.  She has minimal suprapubic pain and low suspicion for PID at this time as she is having no vaginal discharge.    2:23 PM Labs consistent with mild hypokalemia of 3.0, hemoglobin, renal function and sodium are within normal limits.  Patient's urine with  greater than 80 ketones and 6-10 red and white cells.  Sample is mildly contaminated but no evidence of UTI.  Suspect this is dehydration related to emesis and pregnancy.  Patient given a bolus of LR and 1 bolus of normal saline and Zofran.  Lipase is also within normal limits.  Will make sure patient can tolerate p.o.'s prior to discharge.  Final Clinical Impressions(s) / ED Diagnoses   Final diagnoses:  First trimester pregnancy  Dehydration  Morning sickness    ED Discharge Orders    None       Gwyneth SproutPlunkett, Atiana Levier, MD 02/04/19 1447

## 2019-02-06 ENCOUNTER — Other Ambulatory Visit: Payer: Self-pay

## 2019-02-06 ENCOUNTER — Emergency Department (HOSPITAL_BASED_OUTPATIENT_CLINIC_OR_DEPARTMENT_OTHER)
Admission: EM | Admit: 2019-02-06 | Discharge: 2019-02-06 | Disposition: A | Discharge status: Home / Self Care | Payer: BLUE CROSS/BLUE SHIELD | Attending: Emergency Medicine | Admitting: Emergency Medicine

## 2019-02-06 ENCOUNTER — Encounter (HOSPITAL_BASED_OUTPATIENT_CLINIC_OR_DEPARTMENT_OTHER): Payer: Self-pay

## 2019-02-06 DIAGNOSIS — Z79899 Other long term (current) drug therapy: Secondary | ICD-10-CM | POA: Diagnosis not present

## 2019-02-06 DIAGNOSIS — R1011 Right upper quadrant pain: Secondary | ICD-10-CM

## 2019-02-06 DIAGNOSIS — Z3A01 Less than 8 weeks gestation of pregnancy: Secondary | ICD-10-CM | POA: Diagnosis not present

## 2019-02-06 DIAGNOSIS — F17210 Nicotine dependence, cigarettes, uncomplicated: Secondary | ICD-10-CM | POA: Insufficient documentation

## 2019-02-06 DIAGNOSIS — O9989 Other specified diseases and conditions complicating pregnancy, childbirth and the puerperium: Secondary | ICD-10-CM | POA: Insufficient documentation

## 2019-02-06 DIAGNOSIS — O210 Mild hyperemesis gravidarum: Secondary | ICD-10-CM | POA: Insufficient documentation

## 2019-02-06 DIAGNOSIS — O99331 Smoking (tobacco) complicating pregnancy, first trimester: Secondary | ICD-10-CM | POA: Diagnosis not present

## 2019-02-06 LAB — CBC WITH DIFFERENTIAL/PLATELET
Abs Immature Granulocytes: 0.01 10*3/uL (ref 0.00–0.07)
Basophils Absolute: 0 10*3/uL (ref 0.0–0.1)
Basophils Relative: 1 %
Eosinophils Absolute: 0.1 10*3/uL (ref 0.0–0.5)
Eosinophils Relative: 1 %
HCT: 38.7 % (ref 36.0–46.0)
Hemoglobin: 13.6 g/dL (ref 12.0–15.0)
Immature Granulocytes: 0 %
Lymphocytes Relative: 42 %
Lymphs Abs: 2.5 10*3/uL (ref 0.7–4.0)
MCH: 32.8 pg (ref 26.0–34.0)
MCHC: 35.1 g/dL (ref 30.0–36.0)
MCV: 93.3 fL (ref 80.0–100.0)
Monocytes Absolute: 0.5 10*3/uL (ref 0.1–1.0)
Monocytes Relative: 9 %
Neutro Abs: 2.9 10*3/uL (ref 1.7–7.7)
Neutrophils Relative %: 47 %
Platelets: 279 10*3/uL (ref 150–400)
RBC: 4.15 MIL/uL (ref 3.87–5.11)
RDW: 11.8 % (ref 11.5–15.5)
WBC: 6.1 10*3/uL (ref 4.0–10.5)
nRBC: 0 % (ref 0.0–0.2)

## 2019-02-06 LAB — COMPREHENSIVE METABOLIC PANEL
ALT: 12 U/L (ref 0–44)
AST: 13 U/L — ABNORMAL LOW (ref 15–41)
Albumin: 3.9 g/dL (ref 3.5–5.0)
Alkaline Phosphatase: 48 U/L (ref 38–126)
Anion gap: 9 (ref 5–15)
BUN: <5 mg/dL — ABNORMAL LOW (ref 6–20)
CO2: 22 mmol/L (ref 22–32)
Calcium: 9.1 mg/dL (ref 8.9–10.3)
Chloride: 101 mmol/L (ref 98–111)
Creatinine, Ser: 0.59 mg/dL (ref 0.44–1.00)
GFR calc Af Amer: >60 mL/min (ref >60)
GFR calc non Af Amer: >60 mL/min (ref >60)
Glucose, Bld: 102 mg/dL — ABNORMAL HIGH (ref 70–99)
Potassium: 3.2 mmol/L — ABNORMAL LOW (ref 3.5–5.1)
Sodium: 132 mmol/L — ABNORMAL LOW (ref 135–145)
Total Bilirubin: 0.5 mg/dL (ref 0.3–1.2)
Total Protein: 7.1 g/dL (ref 6.5–8.1)

## 2019-02-06 LAB — URINALYSIS, ROUTINE W REFLEX MICROSCOPIC
Bilirubin Urine: NEGATIVE
Glucose, UA: NEGATIVE mg/dL
Ketones, ur: 15 mg/dL — AB
Leukocytes,Ua: NEGATIVE
Nitrite: NEGATIVE
Protein, ur: NEGATIVE mg/dL
Specific Gravity, Urine: 1.02 (ref 1.005–1.030)
pH: 7.5 (ref 5.0–8.0)

## 2019-02-06 LAB — URINALYSIS, MICROSCOPIC (REFLEX)

## 2019-02-06 LAB — LIPASE, BLOOD: Lipase: 76 U/L — ABNORMAL HIGH (ref 11–51)

## 2019-02-06 MED ORDER — FAMOTIDINE IN NACL 20-0.9 MG/50ML-% IV SOLN
20.0000 mg | Freq: Once | INTRAVENOUS | Status: AC
  Administered 2019-02-06: 20 mg via INTRAVENOUS
  Filled 2019-02-06: qty 50

## 2019-02-06 MED ORDER — ONDANSETRON HCL 4 MG/2ML IJ SOLN
4.0000 mg | Freq: Once | INTRAMUSCULAR | Status: AC
  Administered 2019-02-06: 22:00:00 4 mg via INTRAVENOUS
  Filled 2019-02-06: qty 2

## 2019-02-06 MED ORDER — SODIUM CHLORIDE 0.9 % IV BOLUS
1000.0000 mL | Freq: Once | INTRAVENOUS | Status: AC
  Administered 2019-02-06: 1000 mL via INTRAVENOUS

## 2019-02-06 MED ORDER — PROMETHAZINE HCL 25 MG PO TABS: 25.0000 mg | ORAL_TABLET | Freq: Four times a day (QID) | ORAL | 0 refills | Status: DC | PRN

## 2019-02-06 MED ORDER — ONDANSETRON HCL 4 MG/2ML IJ SOLN
4.0000 mg | Freq: Once | INTRAMUSCULAR | Status: AC
  Administered 2019-02-06: 4 mg via INTRAVENOUS
  Filled 2019-02-06: qty 2

## 2019-02-06 NOTE — ED Notes (Signed)
Pt given gingerale for PO challenge 

## 2019-02-06 NOTE — ED Provider Notes (Addendum)
[facility] EMERGENCY DEPARTMENT Provider Note   CSN: [id number] Arrival date & time: 02/06/19  2125    History   Chief Complaint Chief Complaint  Patient presents with  . Emesis During Pregnancy    HPI Vicki Hull is a 28 y.o. female.     Patient is a 28 year old female who is about [redacted] weeks pregnant who presents with nausea and vomiting.  She is G4, P3.  She has had ongoing nausea and vomiting for about the last week.  She was seen here 2 days ago for similar symptoms and was given a prescription for Reglan but she says it is not helping.  She says she cannot keep anything down.  She does have some pain in her right mid abdomen that hurts mostly after she vomits.  She denies any vaginal bleeding or discharge.  No urinary symptoms.  She has not obtained prenatal care.  She states that she has an appointment tomorrow for termination of the pregnancy.     History reviewed. No pertinent past medical history.  There are no active problems to display for this patient.   Past Surgical History:  Procedure Laterality Date  . KNEE SURGERY    . TONSILLECTOMY       OB History    Gravida  4   Para  2   Term  2   Preterm  0   AB  0   Living  2     SAB  0   TAB  0   Ectopic  0   Multiple  0   Live Births  2            Home Medications    Prior to Admission medications   Medication Sig Start Date End Date Taking? Authorizing Provider  cyclobenzaprine (FLEXERIL) 10 MG tablet Take 1 tablet (10 mg total) by mouth 2 (two) times daily as needed for muscle spasms. 08/03/18   Fayrene Helper, PA-C  ibuprofen (ADVIL,MOTRIN) 600 MG tablet Take 1 tablet (600 mg total) by mouth every 6 (six) hours as needed. 08/03/18   Fayrene Helper, PA-C  metoCLOPramide (REGLAN) 10 MG tablet Take 1 tablet (10 mg total) by mouth every 6 (six) hours. 02/04/19   Gwyneth Sprout, MD  ondansetron (ZOFRAN) 4 MG tablet Take 4 mg by mouth every 8 (eight) hours as needed for nausea.     [provider]  potassium chloride SA (K-DUR) 20 MEQ tablet Take 1 tablet (20 mEq total) by mouth 2 (two) times daily for 3 days. 02/04/19 02/07/19  Pricilla Loveless, MD  Prenatal Vit-Fe Fumarate-FA (PRENATAL MULTIVITAMIN) TABS Take 1 tablet by mouth daily at 12 noon.    [provider]  promethazine (PHENERGAN) 25 MG tablet Take 1 tablet (25 mg total) by mouth every 6 (six) hours as needed for nausea or vomiting. 02/06/19   Rolan Bucco, MD    Family History No family history on file.  Social History Social History   Tobacco Use  . Smoking status: Current Every Day Smoker    Types: Cigarettes  . Smokeless tobacco: Never Used  Substance Use Topics  . Alcohol use: Never    Frequency: Never  . Drug use: Not on file     Allergies   Patient has no known allergies.   Review of Systems Review of Systems  Constitutional: Negative for chills, diaphoresis, fatigue and fever.  HENT: Negative for congestion, rhinorrhea and sneezing.   Eyes: Negative.   Respiratory: Negative for cough,  chest tightness and shortness of breath.   Cardiovascular: Negative for chest pain and leg swelling.  Gastrointestinal: Positive for abdominal pain, nausea and vomiting. Negative for blood in stool and diarrhea.  Genitourinary: Negative for difficulty urinating, flank pain, frequency and hematuria.  Musculoskeletal: Negative for arthralgias and back pain.  Skin: Negative for rash.  Neurological: Negative for dizziness, speech difficulty, weakness, numbness and headaches.     Physical Exam Updated Vital Signs BP 126/78 (BP Location: Right Arm)   Pulse 79   Temp 98.6 F (37 C) (Oral)   Resp 17   Ht 5\' 4"  (1.626 m)   Wt 90 kg   LMP 12/20/2018   SpO2 99%   BMI 34.06 kg/m   Physical Exam Constitutional:      Appearance: She is well-developed.  HENT:     Head: Normocephalic and atraumatic.  Eyes:     Pupils: Pupils are equal, round, and reactive to light.  Neck:      Musculoskeletal: Normal range of motion and neck supple.  Cardiovascular:     Rate and Rhythm: Normal rate and regular rhythm.     Heart sounds: Normal heart sounds.  Pulmonary:     Effort: Pulmonary effort is normal. No respiratory distress.     Breath sounds: Normal breath sounds. No wheezing or rales.  Chest:     Chest wall: No tenderness.  Abdominal:     General: Bowel sounds are normal.     Palpations: Abdomen is soft.     Tenderness: There is abdominal tenderness in the right upper quadrant and right lower quadrant. There is no guarding or rebound.  Musculoskeletal: Normal range of motion.  Lymphadenopathy:     Cervical: No cervical adenopathy.  Skin:    General: Skin is warm and dry.     Findings: No rash.  Neurological:     Mental Status: She is alert and oriented to person, place, and time.      ED Treatments / Results  Labs (all labs ordered are listed, but only abnormal results are displayed) Labs Reviewed  URINALYSIS, ROUTINE W REFLEX MICROSCOPIC - Abnormal; Notable for the following components:      Result Value   APPearance CLOUDY (*)    Hgb urine dipstick TRACE (*)    Ketones, ur 15 (*)    All other components within normal limits  COMPREHENSIVE METABOLIC PANEL - Abnormal; Notable for the following components:   Sodium 132 (*)    Potassium 3.2 (*)    Glucose, Bld 102 (*)    BUN <5 (*)    AST 13 (*)    All other components within normal limits  LIPASE, BLOOD - Abnormal; Notable for the following components:   Lipase 76 (*)    All other components within normal limits  URINALYSIS, MICROSCOPIC (REFLEX) - Abnormal; Notable for the following components:   Bacteria, UA MANY (*)    All other components within normal limits  CBC WITH DIFFERENTIAL/PLATELET    EKG None  Radiology No results found.  Procedures Procedures (including critical care time)  Medications Ordered in ED Medications  ondansetron (ZOFRAN) injection 4 mg (has no administration in  time range)  sodium chloride 0.9 % bolus 1,000 mL (0 mLs Intravenous Stopped 02/06/19 2250)  ondansetron (ZOFRAN) injection 4 mg (4 mg Intravenous Given 02/06/19 2207)  famotidine (PEPCID) IVPB 20 mg premix (0 mg Intravenous Stopped 02/06/19 2250)     Initial Impression / Assessment and Plan / ED Course  I have  reviewed the triage vital signs and the nursing notes.  Pertinent labs & imaging results that were available during my care of the patient were reviewed by me and considered in my medical decision making (see chart for details).        Patient is a 28 year old female who presents with vomiting and pregnancy.  She had a recent bedside ultrasound which showed an intrauterine pregnancy.  She denies any lower abdominal pain or vaginal bleeding.  She does have some tenderness in her right upper quadrant although on reexam after treatment in the ED with IV fluids and antiemetics, her pain has completely resolved.  Her abdomen reexam showed no tenderness.  Her lipase is slightly elevated but her other LFTs are normal.  I did discuss with her doing a gallbladder ultrasound.  Were unable to do an ultrasound tonight but I will bring her back tomorrow to do one.  She has no fever or elevated white count to suggest cholecystitis.  She was given IV fluids and antiemetics and is tolerating oral fluids without vomiting.  She is planning tomorrow to have a termination of the pregnancy.  Otherwise she was instructed to follow-up with OB/GYN if she changes her mind about this.  She was given a prescription for Phenergan to use for symptomatic relief.  Return precautions were given.  Final Clinical Impressions(s) / ED Diagnoses   Final diagnoses:  Hyperemesis gravidarum    ED Discharge Orders         Ordered    US Abdomen Limited RUQ/Gall Bladder     02/06/19 2302    promethazine (PHENERGAN) 25 MG tablet  Every 6 hours PRN     02/06/19 2304           Rolan Bucco, MD 02/06/19 0177    Rolan Bucco, MD 02/06/19 2313

## 2019-02-06 NOTE — ED Triage Notes (Signed)
Pt here w/ nausea and vomiting. Pt is [redacted]weeks pregnant. Pt seen Saturday for same. Pt has been taking reglan but states "it makes me vomit more"

## 2019-02-07 ENCOUNTER — Ambulatory Visit (HOSPITAL_BASED_OUTPATIENT_CLINIC_OR_DEPARTMENT_OTHER): Admission: RE | Admit: 2019-02-07 | Payer: BLUE CROSS/BLUE SHIELD | Source: Ambulatory Visit

## 2020-10-14 ENCOUNTER — Other Ambulatory Visit: Payer: Self-pay

## 2020-10-14 ENCOUNTER — Encounter (HOSPITAL_BASED_OUTPATIENT_CLINIC_OR_DEPARTMENT_OTHER): Payer: Self-pay

## 2020-10-14 DIAGNOSIS — Z5321 Procedure and treatment not carried out due to patient leaving prior to being seen by health care provider: Secondary | ICD-10-CM | POA: Insufficient documentation

## 2020-10-14 DIAGNOSIS — M549 Dorsalgia, unspecified: Secondary | ICD-10-CM | POA: Insufficient documentation

## 2020-10-14 DIAGNOSIS — R200 Anesthesia of skin: Secondary | ICD-10-CM | POA: Insufficient documentation

## 2020-10-14 NOTE — ED Triage Notes (Signed)
Pt presents from home with left arm numbness, back pain, and "heart hurting." States it has been going on for about a week.

## 2020-10-15 ENCOUNTER — Emergency Department (HOSPITAL_BASED_OUTPATIENT_CLINIC_OR_DEPARTMENT_OTHER)
Admission: EM | Admit: 2020-10-15 | Discharge: 2020-10-15 | Disposition: A | Discharge status: Home / Self Care | Payer: BLUE CROSS/BLUE SHIELD | Attending: Emergency Medicine | Admitting: Emergency Medicine

## 2021-06-09 ENCOUNTER — Encounter (INDEPENDENT_AMBULATORY_CARE_PROVIDER_SITE_OTHER): Payer: Self-pay | Admitting: Family

## 2021-09-23 ENCOUNTER — Encounter: Payer: Self-pay | Admitting: Family Medicine

## 2021-09-23 ENCOUNTER — Ambulatory Visit: Payer: 59 | Admitting: Family Medicine

## 2021-09-23 ENCOUNTER — Other Ambulatory Visit: Payer: Self-pay

## 2021-09-23 VITALS — BP 110/90 | HR 66 | Temp 98.4°F | Ht 62.0 in | Wt 218.4 lb

## 2021-09-23 DIAGNOSIS — Z23 Encounter for immunization: Secondary | ICD-10-CM

## 2021-09-23 DIAGNOSIS — Z1159 Encounter for screening for other viral diseases: Secondary | ICD-10-CM | POA: Diagnosis not present

## 2021-09-23 DIAGNOSIS — R87612 Low grade squamous intraepithelial lesion on cytologic smear of cervix (LGSIL): Secondary | ICD-10-CM | POA: Diagnosis not present

## 2021-09-23 DIAGNOSIS — L301 Dyshidrosis [pompholyx]: Secondary | ICD-10-CM | POA: Diagnosis not present

## 2021-09-23 DIAGNOSIS — Z114 Encounter for screening for human immunodeficiency virus [HIV]: Secondary | ICD-10-CM

## 2021-09-23 DIAGNOSIS — Z13220 Encounter for screening for lipoid disorders: Secondary | ICD-10-CM

## 2021-09-23 DIAGNOSIS — Z131 Encounter for screening for diabetes mellitus: Secondary | ICD-10-CM

## 2021-09-23 DIAGNOSIS — E785 Hyperlipidemia, unspecified: Secondary | ICD-10-CM | POA: Insufficient documentation

## 2021-09-23 LAB — LIPID PANEL
Cholesterol: 216 mg/dL — ABNORMAL HIGH (ref 0–200)
HDL: 55.8 mg/dL (ref >39.00)
LDL Cholesterol: 143 mg/dL — ABNORMAL HIGH (ref 0–99)
NonHDL: 160.37
Total CHOL/HDL Ratio: 4
Triglycerides: 89 mg/dL (ref 0.0–149.0)
VLDL: 17.8 mg/dL (ref 0.0–40.0)

## 2021-09-23 LAB — GLUCOSE, RANDOM: Glucose, Bld: 86 mg/dL (ref 70–99)

## 2021-09-23 LAB — HEMOGLOBIN A1C: Hgb A1c MFr Bld: 5.5 % (ref 4.6–6.5)

## 2021-09-23 MED ORDER — CLOBETASOL PROPIONATE 0.05 % EX OINT: 1.0000 "application " | TOPICAL_OINTMENT | Freq: Two times a day (BID) | CUTANEOUS | 1 refills | Status: DC

## 2021-09-23 NOTE — Progress Notes (Signed)
[facility] PRIMARY CARE [facility] [address] [place] [address] Dept: [phone] Dept Fax: [phone]  New Patient Office Visit  Subjective:    Patient ID: Russ Halo, female    DOB: 1991/01/08, 30 y.o..   MRN: [id number]  Chief Complaint  Patient presents with   Establish Care    NP. Est care. Pt c/o rash on right ankle.     History of Present Illness:  Patient is in today to establish care. Ms. Prestage was born in [place]. She attended some college through [employer] and [employer]. 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 She has a significant other. She has three children (15, 62, 66 yo). She denies tobacco use, but drinks alcohol and uses marijuana on the weekends.  Ms. Pell has a history of abnormal pap smears. She has had prior colposcopy performed by Dr. Shawnie Pons (GYN). She notes that she is concerned about this, in light of her mother's history of cervical cancer. She had become afraid of follow-up for this, so has not been back in quite some time.   Ms. Cowles notes that she has a rash present on her right ankle. This started around the time she was wearing an anklet. She thought it might have been a metal allergy. However, since stopping wearing the anklet, the rash has spread on the right foot and ankle and now there is some on the left. She also admits to some similar rash on her fingers, which she thought might have come form her nail salon. She had borrowed some triamcinolone cream from a family member. This led to some mild improvement int he rash.  Past Medical History: Patient Active Problem List   Diagnosis Date Noted   Low grade squamous intraepithelial lesion (LGSIL) on Papanicolaou smear of cervix 07/01/2016   Cervical high risk HPV (human papillomavirus) test positive 07/01/2016   Past Surgical History:  Procedure Laterality Date   KNEE ARTHROSCOPY Right     TONSILLECTOMY     Family History  Problem Relation Age of Onset   Diabetes Mother    Cancer Mother        Cervical   Heart disease Mother    Diabetes Sister    Diabetes Son        Type 1   Outpatient Medications Prior to Visit  Medication Sig Dispense Refill   cyclobenzaprine (FLEXERIL) 10 MG tablet Take 1 tablet (10 mg total) by mouth 2 (two) times daily as needed for muscle spasms. 20 tablet 0   ibuprofen (ADVIL,MOTRIN) 600 MG tablet Take 1 tablet (600 mg total) by mouth every 6 (six) hours as needed. 30 tablet 0   metoCLOPramide (REGLAN) 10 MG tablet Take 1 tablet (10 mg total) by mouth every 6 (six) hours. 20 tablet 0   ondansetron (ZOFRAN) 4 MG tablet Take 4 mg by mouth every 8 (eight) hours as needed for nausea.     potassium chloride SA (K-DUR) 20 MEQ tablet Take 1 tablet (20 mEq total) by mouth 2 (two) times daily for 3 days. 6 tablet 0   Prenatal Vit-Fe Fumarate-FA (PRENATAL MULTIVITAMIN) TABS Take 1 tablet by mouth daily at 12 noon.     promethazine (PHENERGAN) 25 MG tablet Take 1 tablet (25 mg total) by mouth every 6 (six) hours as needed for nausea or vomiting. 15 tablet 0   No facility-administered medications prior to visit.   No Known Allergies    Objective:  Today's Vitals   09/23/21 1037  Pulse: 66  Temp: 98.4 F (36.9 C)  TempSrc: Temporal  Weight: 218 lb 6.4 oz (99.1 kg)  Height: 5\' 2"  (1.575 m)   Body mass index is 39.95 kg/m.   General: Well developed, well nourished. No acute distress. Skin: Warm and dry. There are large patches of confluent papular rash on both aspects of the right ankle   and the dorsum of the foot. There are multiple papules as well in the paronychium of most fingers and the   margins of the fingers. Psych: Alert and oriented. Normal mood and affect.  Health Maintenance Due  Topic Date Due   Pneumococcal Vaccine 55-69 Years old (1 - PCV) Never done   Hepatitis C Screening  Never done   TETANUS/TDAP  Never done   PAP  SMEAR-Modifier  Never done   COVID-19 Vaccine (3 - Booster for Pfizer series) 02/21/2020     Assessment & Plan:   1. Low grade squamous intraepithelial lesion (LGSIL) on Papanicolaou smear of cervix I strongly advised Ms. Mesch to follow-up with her gynecologist. I discussed that there are treatments for dysplasia that can prevent cancer. I pointed out the importance of regular monitoring of this to help prevent cervical cancer.  2. Dyshidrotic eczema Ms. Doshi's rash is consistent with dyshidrosis. I recommend she avoid excessive soap and water exposure. She should use a moisturizing lotion on her hands and feet daily. I will prescribe clobetasol to try and improve the rash. If not getting better in the next month, she should return for reassessment.  - clobetasol ointment (TEMOVATE) 0.05 %; Apply 1 application topically 2 (two) times daily.  Dispense: 30 g; Refill: 1  3. Need for Tdap vaccination  - Tdap vaccine greater than or equal to 7yo IM  4. Encounter for hepatitis C screening test for low risk patient  - HCV Ab w Reflex to Quant PCR  5. Screening for HIV (human immunodeficiency virus)  - HIV Antibody (routine testing w rflx)  6. Screening for diabetes mellitus (DM)  - Hemoglobin A1c - Glucose, random  7. Screening for lipid disorders  - Lipid panel   Pollie Meyer, MD

## 2021-09-23 NOTE — Patient Instructions (Signed)
Dyshidrotic Eczema Dyshidrotic eczema, also known as pompholyx, is a type of eczema that causes very itchy, fluid-filled blisters (vesicles) to form on the hands and feet. It is more common before age 31, though it can affect people of any age. There is no cure, but treatment and certain lifestylechanges can help relieve symptoms. What are the causes? The cause of this condition is not known. What increases the risk? You are more likely to develop this condition if: You wash your hands frequently. You have a personal or family history of eczema, allergies, asthma, or hay fever. You are allergic to metals, such as nickel or cobalt. You work with cement. You smoke. What are the signs or symptoms? Symptoms of this condition may affect the hands, the feet, or both. Symptoms may come and go (recur), and may include: Severe itching. This may happen before blisters appear. Blisters. These may form suddenly. In the early stages, blisters may form near the fingertips. In severe cases, blisters may grow to large blister masses (bullae). Blisters resolve in 2-3 weeks without bursting. This is followed by a dry phase in which itching eases. Pain and swelling. Cracks or long, narrow openings (fissures) in the skin. Severe dryness. Ridges on the nails. How is this diagnosed? This condition may be diagnosed based on: Your symptoms and a physical exam. Your medical history. Skin scrapings to rule out a fungal infection. Testing a swab of fluid for bacteria (culture). Removing a small piece of skin (biopsy) to test for infection or to rule out other conditions. Skin patch tests. These tests involve using patches that contain possible allergens and placing them on your back. Your health care provider will wait a few days and then check to see if an allergic reaction occurred. These tests may be done if your health care provider suspects allergic reactions, or to rule out other types of eczema. You may be  referred to a health care provider who specializes in skin conditions (dermatologist) to help diagnose and treat this condition. How is this treated? There is no cure for this condition, but treatment can help relieve symptoms. Depending on the amount and severity of the blisters, your health care provider may suggest: Avoiding allergens, irritants, or triggers that worsen symptoms. This may involve lifestyle changes, such as: Using different lotions or soaps. Avoiding hot weather or places that will cause you to sweat a lot. Managing stress with coping techniques, such as relaxation and exercise, and asking for help when you need it. Diet changes as recommended by your health care provider. Using a clean, damp towel (cool compress) to relieve symptoms. Soaking in a bath that contains a type of salt that relieves irritation (aluminum acetate soaks). Medicines, such as: Medicine taken by mouth to reduce itching (oral antihistamines). Medicine applied to the skin to reduce swelling and irritation (topical corticosteroids). Medicine that reduces the activity of the body's disease-fighting system (immunosuppressants) to treat inflammation. This may be given in severe cases. Antibiotic medicines to treat bacterial infection. Light therapy (phototherapy). This involves shining ultraviolet (UV) light on the affected skin in order to reduce itchiness and inflammation. Follow these instructions at home: Bathing and skin care  Wash skin gently. After bathing or washing your hands, pat your skin dry. Avoid rubbing your skin. Remove all jewelry before bathing. If the skin under the jewelry stays wet, blisters may form or get worse. Apply cool compresses as told by your health care provider. To do this: Soak a clean towel in cool   water. Wring out excess water until towel is damp. Place the towel over the affected skin. Leave the towel on for 20 minutes at a time, 2-3 times a day. Use mild soaps,  cleansers, and lotions that do not contain dyes, perfumes, or other irritants. Keep your skin hydrated. To do this: Avoid very hot water. Take lukewarm baths or showers. Apply moisturizer within 3 minutes of bathing. This locks in moisture.  Medicines Take and apply over-the-counter and prescription medicines only as told by your health care provider. If you were prescribed an antibiotic medicine, take or apply it as told by your health care provider. Do not stop using the antibiotic even if you start to feel better. General instructions Do not use any products that contain nicotine or tobacco. These include cigarettes, chewing tobacco, and vaping devices, such as e-cigarettes. If you need help quitting, ask your health care provider. Identify and avoid triggers and allergens. Keep fingernails short to avoid breaking the skin while scratching. Use waterproof gloves to protect your hands when doing work that keeps your hands wet for a long time. Wear socks to keep your feet dry. Keep all follow-up visits. This is important. Contact a health care provider if: You have symptoms that do not go away. You have signs of infection, such as: Crusting, pus, or a bad smell. More redness, swelling, or pain. Increased warmth in the affected area. Get help right away if: Your skin gets streaking redness with associated pain. Summary Dyshidrotic eczema, also known as pompholyx, is a type of eczema that causes very itchy, fluid-filled blisters (vesicles) to form on the hands and feet. The cause of this condition is not known. There is no cure for this condition, but treatment can help relieve symptoms. Treatment depends on the amount and severity of the blisters. Use mild soaps, cleansers, and lotions that do not contain dyes, perfumes, or other irritants. Keep your skin hydrated. This information is not intended to replace advice given to you by your health care provider. Make sure you discuss any  questions you have with your healthcare provider. Document Revised: 07/08/2020 Document Reviewed: 07/08/2020 Elsevier Patient Education  2022 Elsevier Inc.  

## 2021-09-24 LAB — HCV INTERPRETATION

## 2021-09-24 LAB — HIV ANTIBODY (ROUTINE TESTING W REFLEX): HIV 1&2 Ab, 4th Generation: NONREACTIVE

## 2021-09-24 LAB — HCV AB W REFLEX TO QUANT PCR: HCV Ab: <0.1 s/co ratio (ref 0.0–0.9)

## 2021-09-29 ENCOUNTER — Ambulatory Visit: Payer: 59 | Admitting: Family Medicine

## 2021-10-01 ENCOUNTER — Ambulatory Visit: Payer: 59 | Admitting: Family Medicine

## 2021-11-19 ENCOUNTER — Ambulatory Visit: Payer: 59 | Admitting: Family Medicine

## 2021-11-19 ENCOUNTER — Other Ambulatory Visit: Payer: Self-pay

## 2021-11-19 VITALS — BP 116/70 | HR 76 | Temp 97.4°F | Ht 62.0 in | Wt 220.0 lb

## 2021-11-19 DIAGNOSIS — L301 Dyshidrosis [pompholyx]: Secondary | ICD-10-CM | POA: Diagnosis not present

## 2021-11-19 MED ORDER — PREDNISONE 20 MG PO TABS: ORAL_TABLET | ORAL | 0 refills | Status: AC

## 2021-11-19 NOTE — Progress Notes (Signed)
[facility] [address] [place] [address] Dept: [phone] Dept Fax: [phone]  Office Visit  Subjective:    Patient ID: Carmina Miller, female    DOB: 03/10/1991, 31 y.o..   MRN: [id number]  Chief Complaint  Patient presents with   Follow-up    2 month f/u. C/o that eczema is getting worse.  Declines flu shot.     History of Present Illness:  Patient is in today for reassessment of her dyshidrotic eczema. I had seen her to evaluate this condition in mid-December. This was impacting both her hands and feet. I prescribed clobetasol ointment. She has noted improvement in the rash on the feet, but is noticing increased issues with the skin, including areas that are now peelign and raw. She has been avoiding soaps and frequent hand washing.  Past Medical History: Patient Active Problem List   Diagnosis Date Noted   Dyshidrotic eczema 09/23/2021   Borderline hyperlipidemia 09/23/2021   Low grade squamous intraepithelial lesion (LGSIL) on Papanicolaou smear of cervix 07/01/2016   Cervical high risk HPV (human papillomavirus) test positive 07/01/2016   Past Surgical History:  Procedure Laterality Date   KNEE ARTHROSCOPY Right    TONSILLECTOMY     Family History  Problem Relation Age of Onset   Diabetes Mother    Cancer Mother        Cervical   Heart disease Mother    Diabetes Sister    Diabetes Son        Type 1   Outpatient Medications Prior to Visit  Medication Sig Dispense Refill   clobetasol ointment (TEMOVATE) [id number] % Apply 1 application topically 2 (two) times daily. 30 g 1   cyclobenzaprine (FLEXERIL) 10 MG tablet Take 1 tablet (10 mg total) by mouth 2 (two) times daily as needed for muscle spasms. 20 tablet 0   ibuprofen (ADVIL,MOTRIN) 600 MG tablet Take 1 tablet (600 mg total) by mouth every 6 (six) hours as needed. 30 tablet 0   metoCLOPramide (REGLAN) 10 MG tablet Take 1 tablet (10 mg total) by mouth  every 6 (six) hours. 20 tablet 0   ondansetron (ZOFRAN) 4 MG tablet Take 4 mg by mouth every 8 (eight) hours as needed for nausea.     potassium chloride SA (K-DUR) 20 MEQ tablet Take 1 tablet (20 mEq total) by mouth 2 (two) times daily for 3 days. 6 tablet 0   Prenatal Vit-Fe Fumarate-FA (PRENATAL MULTIVITAMIN) TABS Take 1 tablet by mouth daily at 12 noon.     promethazine (PHENERGAN) 25 MG tablet Take 1 tablet (25 mg total) by mouth every 6 (six) hours as needed for nausea or vomiting. 15 tablet 0   No facility-administered medications prior to visit.   No Known Allergies    Objective:   Today's Vitals   11/19/21 1134  BP: 116/70  Pulse: 76  Temp: (!) 97.4 F (36.3 C)  TempSrc: Temporal  SpO2: 99%  Weight: 220 lb (99.8 kg)  Height: 5\' 2"  (1.575 m)   Body mass index is 40.24 kg/m.   General: Well developed, well nourished. No acute distress. Skin: Warm and dry. There remains area of a papular rash with some superficial scaliness and peeling along the   margins of the fingers and the palms. Psych: Alert and oriented. Normal mood and affect.  Health Maintenance Due  Topic Date Due   PAP SMEAR-Modifier  Never done   COVID-19 Vaccine (3 - Booster for Pfizer series) 02/21/2020  Assessment & Plan:   1. Dyshidrotic eczema Ms. Makley has failed topical steroids for this rash. I will place her on a course of oral steroids, tapering down over 3 weeks. If not improved, would recommend dermatology follow-up.  - predniSONE (DELTASONE) 20 MG tablet; Take 2 tablets (40 mg total) by mouth daily with breakfast for 7 days, THEN 1 tablet (20 mg total) daily with breakfast for 7 days, THEN 0.5 tablets (10 mg total) daily with breakfast for 6 days.  Dispense: 24 tablet; Refill: 0  Haydee Salter, MD

## 2021-11-24 ENCOUNTER — Ambulatory Visit: Payer: 59 | Admitting: Family Medicine

## 2021-12-10 ENCOUNTER — Telehealth: Payer: Self-pay | Admitting: Family Medicine

## 2021-12-10 ENCOUNTER — Ambulatory Visit: Payer: 59 | Admitting: Family Medicine

## 2021-12-10 NOTE — Telephone Encounter (Signed)
Patient/Caregiver was notified of No Show/Late Cancellation Policy & possible $50 charge. ?Visit was cancelled with reason "No Show/Cancel within 24 hours" for tracking & charging. ? ?Caller Name: Alissandra Geoffroy  ?Caller Ph #: [phone] ?Date of APPT: 12/10/21 ?Reason given for no show/late cancellation: no reason No show  ?No Show Letter printed & put in outgoing mail (Yes/No): yes ? ?~~~Route message to admin supervisor and clinical team/CMA~~~ ? ? ? ?

## 2021-12-23 NOTE — Telephone Encounter (Signed)
2nd no show, letter sent, fee generated, additional no show will be dismissal ?

## 2022-01-14 ENCOUNTER — Encounter: Payer: Self-pay | Admitting: Family Medicine

## 2022-01-14 ENCOUNTER — Ambulatory Visit: Payer: 59 | Admitting: Family Medicine

## 2022-01-14 VITALS — BP 114/78 | HR 70 | Temp 96.6°F | Ht 62.0 in | Wt 227.0 lb

## 2022-01-14 DIAGNOSIS — L301 Dyshidrosis [pompholyx]: Secondary | ICD-10-CM | POA: Diagnosis not present

## 2022-01-14 MED ORDER — CLOBETASOL PROPIONATE 0.05 % EX OINT: 1.0000 "application " | TOPICAL_OINTMENT | Freq: Two times a day (BID) | CUTANEOUS | 1 refills | Status: DC

## 2022-01-14 MED ORDER — PREDNISONE 10 MG (21) PO TBPK: ORAL_TABLET | ORAL | 0 refills | Status: DC

## 2022-01-14 NOTE — Patient Instructions (Signed)
Continue to apply clobetasol cream.  This has been refilled. ?Take prednisone as prescribed. ?Apply topical emollients like Vaseline at night to area and wrap to continue to seal in moisture. ?We have referred to dermatology for further help. ?

## 2022-01-14 NOTE — Progress Notes (Signed)
? ?  Vicki Hull is a 31 y.o. female who presents today for an office visit. ? ?Assessment/Plan:  ? ?Chronic Problems Addressed Today: ?Dyshidrotic eczema ?Dyshidrotic eczema flare, not controlled on topical clobetasol, this is the second round of oral prednisone needed (prescribed today), discussed treatment options and recommended referral to dermatology, patient agreed ?Referral to dermatology placed ? ?  ?Subjective:  ?HPI: ? ?Patient had recurrence of rash on right hand particularly on the lateral side of right middle finger, describes it as itchy.  Denies pus or drainage.  Patient has continued to apply topical clobetasol and also OTC prednisone.  Patient did have recent course of prednisone oral which along with the clobetasol helped the rash.  But the rash reoccurred about 1 week after the oral prednisone was completed. ? ?   ?  ?Objective:  ?Physical Exam: ?BP 114/78 (BP Location: Left Arm, Patient Position: Sitting, Cuff Size: Normal)   Pulse 70   Temp (!) 96.6 ?F (35.9 ?C) (Temporal)   Ht 5\' 2"  (1.575 m)   Wt 227 lb (103 kg)   SpO2 99%   BMI 41.52 kg/m?   ?Gen: No acute distress, resting comfortably ?Skin: Warm and dry, recurrence of dyshidrotic eczema on right hand predominantly on right middle finger, multiple open sores due to excoriation, no evidence of active infection ?Neuro: Grossly normal, moves all extremities ?Psych: Normal affect and thought content ? ?   ? ? , MD ?01/14/2022 11:13 AM  ?

## 2022-01-14 NOTE — Assessment & Plan Note (Signed)
Dyshidrotic eczema flare, not controlled on topical clobetasol, this is the second round of oral prednisone needed (prescribed today), discussed treatment options and recommended referral to dermatology, patient agreed ?Referral to dermatology placed ?

## 2022-01-15 ENCOUNTER — Ambulatory Visit: Payer: 59 | Admitting: Family Medicine

## 2022-05-05 ENCOUNTER — Other Ambulatory Visit: Payer: Self-pay | Admitting: Family Medicine

## 2022-05-05 DIAGNOSIS — L301 Dyshidrosis [pompholyx]: Secondary | ICD-10-CM

## 2023-03-16 DIAGNOSIS — Z124 Encounter for screening for malignant neoplasm of cervix: Secondary | ICD-10-CM | POA: Diagnosis not present

## 2023-03-16 DIAGNOSIS — Z1151 Encounter for screening for human papillomavirus (HPV): Secondary | ICD-10-CM | POA: Diagnosis not present

## 2023-03-16 DIAGNOSIS — Z113 Encounter for screening for infections with a predominantly sexual mode of transmission: Secondary | ICD-10-CM | POA: Diagnosis not present

## 2023-12-13 ENCOUNTER — Other Ambulatory Visit (HOSPITAL_BASED_OUTPATIENT_CLINIC_OR_DEPARTMENT_OTHER): Payer: Self-pay

## 2023-12-13 ENCOUNTER — Emergency Department (HOSPITAL_BASED_OUTPATIENT_CLINIC_OR_DEPARTMENT_OTHER): Admission: EM | Admit: 2023-12-13 | Discharge: 2023-12-13 | Disposition: A | Discharge status: Home / Self Care

## 2023-12-13 ENCOUNTER — Encounter (HOSPITAL_BASED_OUTPATIENT_CLINIC_OR_DEPARTMENT_OTHER): Payer: Self-pay | Admitting: Emergency Medicine

## 2023-12-13 ENCOUNTER — Other Ambulatory Visit: Payer: Self-pay

## 2023-12-13 DIAGNOSIS — R110 Nausea: Secondary | ICD-10-CM | POA: Insufficient documentation

## 2023-12-13 DIAGNOSIS — E876 Hypokalemia: Secondary | ICD-10-CM | POA: Diagnosis not present

## 2023-12-13 DIAGNOSIS — O99281 Endocrine, nutritional and metabolic diseases complicating pregnancy, first trimester: Secondary | ICD-10-CM | POA: Insufficient documentation

## 2023-12-13 DIAGNOSIS — E871 Hypo-osmolality and hyponatremia: Secondary | ICD-10-CM | POA: Diagnosis not present

## 2023-12-13 DIAGNOSIS — Z3A01 Less than 8 weeks gestation of pregnancy: Secondary | ICD-10-CM | POA: Diagnosis not present

## 2023-12-13 DIAGNOSIS — O26891 Other specified pregnancy related conditions, first trimester: Secondary | ICD-10-CM | POA: Diagnosis present

## 2023-12-13 LAB — COMPREHENSIVE METABOLIC PANEL
ALT: 16 U/L (ref 0–44)
AST: 15 U/L (ref 15–41)
Albumin: 4 g/dL (ref 3.5–5.0)
Alkaline Phosphatase: 50 U/L (ref 38–126)
Anion gap: 12 (ref 5–15)
BUN: 6 mg/dL (ref 6–20)
CO2: 19 mmol/L — ABNORMAL LOW (ref 22–32)
Calcium: 9.3 mg/dL (ref 8.9–10.3)
Chloride: 102 mmol/L (ref 98–111)
Creatinine, Ser: 0.61 mg/dL (ref 0.44–1.00)
GFR, Estimated: >60 mL/min (ref >60)
Glucose, Bld: 94 mg/dL (ref 70–99)
Potassium: 3.3 mmol/L — ABNORMAL LOW (ref 3.5–5.1)
Sodium: 133 mmol/L — ABNORMAL LOW (ref 135–145)
Total Bilirubin: 0.8 mg/dL (ref 0.0–1.2)
Total Protein: 7.4 g/dL (ref 6.5–8.1)

## 2023-12-13 LAB — CBC
HCT: 40.8 % (ref 36.0–46.0)
Hemoglobin: 14.6 g/dL (ref 12.0–15.0)
MCH: 33 pg (ref 26.0–34.0)
MCHC: 35.8 g/dL (ref 30.0–36.0)
MCV: 92.3 fL (ref 80.0–100.0)
Platelets: 326 10*3/uL (ref 150–400)
RBC: 4.42 MIL/uL (ref 3.87–5.11)
RDW: 11.4 % — ABNORMAL LOW (ref 11.5–15.5)
WBC: 5.6 10*3/uL (ref 4.0–10.5)
nRBC: 0 % (ref 0.0–0.2)

## 2023-12-13 LAB — LIPASE, BLOOD: Lipase: 22 U/L (ref 11–51)

## 2023-12-13 MED ORDER — METOCLOPRAMIDE HCL 10 MG PO TABS
10.0000 mg | ORAL_TABLET | Freq: Four times a day (QID) | ORAL | 0 refills | Status: DC | PRN
  Filled 2023-12-13: qty 30, 8d supply, fill #0

## 2023-12-13 MED ORDER — SODIUM CHLORIDE 0.9 % IV BOLUS
1000.0000 mL | Freq: Once | INTRAVENOUS | Status: AC
  Administered 2023-12-13: 1000 mL via INTRAVENOUS

## 2023-12-13 MED ORDER — POTASSIUM CHLORIDE CRYS ER 20 MEQ PO TBCR
40.0000 meq | EXTENDED_RELEASE_TABLET | Freq: Once | ORAL | Status: AC
  Administered 2023-12-13: 40 meq via ORAL
  Filled 2023-12-13: qty 2

## 2023-12-13 MED ORDER — ONDANSETRON HCL 4 MG/2ML IJ SOLN: 4.0000 mg | Freq: Once | INTRAMUSCULAR | Status: DC

## 2023-12-13 MED ORDER — METOCLOPRAMIDE HCL 5 MG/ML IJ SOLN
10.0000 mg | Freq: Once | INTRAMUSCULAR | Status: AC
  Administered 2023-12-13: 10 mg via INTRAVENOUS
  Filled 2023-12-13: qty 2

## 2023-12-13 NOTE — Discharge Instructions (Signed)
 Is a pleasure taking part in your care.  As discussed, I am sending you home with Reglan.  Please take this for 6 hours as needed for nausea and vomiting.  Please continue to remain hydrated with electrolyte supplementation beverages such as Pedialyte.  Please follow-up with your OB/GYN for further care.  Return to the ED with any new or worsening symptoms.

## 2023-12-13 NOTE — ED Provider Notes (Signed)
 [place] Provider Note   CSN: [id number] Arrival date & time: 12/13/23  1106     History  Chief Complaint  Patient presents with   Nausea    Vicki Hull is a 33 y.o. female G4, P3.  Patient presents to ED for evaluation of nausea.  Patient reports she is [redacted] weeks pregnant.  States that she has had nausea for the last 1 week.  Reports that she has been provided with Zofran, Diclegis.  Reports that 90s medications work to alleviate her nausea.  States she has been unable to have a full meal in the last 1 week.  She is endorsing fatigue, nausea and vomiting.  She denies any abdominal pain, cramping, vaginal spotting or bleeding.  Denies any fevers at home, sore throat, body aches or chills.  Reports she is established with prenatal care.  HPI     Home Medications Prior to Admission medications   Medication Sig Start Date End Date Taking? Authorizing Provider  metoCLOPramide (REGLAN) 10 MG tablet Take 1 tablet (10 mg total) by mouth every 6 (six) hours as needed for nausea or vomiting. 12/13/23  Yes Al Decant, PA-C  clobetasol ointment (TEMOVATE) 0.05 % APPLY TOPICALLY TO THE AFFECTED AREA TWICE DAILY 05/05/22   Loyola Mast, MD  cyclobenzaprine (FLEXERIL) 10 MG tablet Take 1 tablet (10 mg total) by mouth 2 (two) times daily as needed for muscle spasms. 08/03/18   Fayrene Helper, PA-C  ibuprofen (ADVIL,MOTRIN) 600 MG tablet Take 1 tablet (600 mg total) by mouth every 6 (six) hours as needed. 08/03/18   Fayrene Helper, PA-C  potassium chloride SA (K-DUR) 20 MEQ tablet Take 1 tablet (20 mEq total) by mouth 2 (two) times daily for 3 days. 02/04/19 11/19/21  Pricilla Loveless, MD  predniSONE (STERAPRED UNI-PAK 21 TAB) 10 MG (21) TBPK tablet Take as advised on pack 01/14/22   Garnette Gunner, MD      Allergies    Patient has no known allergies.    Review of Systems   Review of Systems  Constitutional:  Negative for fever.   Gastrointestinal:  Positive for nausea and vomiting. Negative for abdominal pain.  Genitourinary:  Negative for pelvic pain, vaginal bleeding and vaginal discharge.  All other systems reviewed and are negative.   Physical Exam Updated Vital Signs BP 119/80 (BP Location: Right Arm)   Pulse 77   Temp 98.2 F (36.8 C) (Oral)   Resp 16   Ht 5\' 3"  (1.6 m)   Wt 103 kg   SpO2 100%   BMI 40.22 kg/m  Physical Exam Vitals and nursing note reviewed.  Constitutional:      General: She is not in acute distress.    Appearance: She is well-developed.  HENT:     Head: Normocephalic and atraumatic.  Eyes:     Conjunctiva/sclera: Conjunctivae normal.  Cardiovascular:     Rate and Rhythm: Normal rate and regular rhythm.     Heart sounds: No murmur heard. Pulmonary:     Effort: Pulmonary effort is normal. No respiratory distress.     Breath sounds: Normal breath sounds.  Abdominal:     Palpations: Abdomen is soft.     Tenderness: There is no abdominal tenderness.     Comments: No tenderness to patient abdomen  Musculoskeletal:        General: No swelling.     Cervical back: Neck supple.  Skin:    General: Skin is warm  and dry.     Capillary Refill: Capillary refill takes less than 2 seconds.  Neurological:     Mental Status: She is alert and oriented to person, place, and time.  Psychiatric:        Mood and Affect: Mood normal.     ED Results / Procedures / Treatments   Labs (all labs ordered are listed, but only abnormal results are displayed) Labs Reviewed  COMPREHENSIVE METABOLIC PANEL - Abnormal; Notable for the following components:      Result Value   Sodium 133 (*)    Potassium 3.3 (*)    CO2 19 (*)    All other components within normal limits  CBC - Abnormal; Notable for the following components:   RDW 11.4 (*)    All other components within normal limits  LIPASE, BLOOD  URINALYSIS, ROUTINE W REFLEX MICROSCOPIC  PREGNANCY, URINE    EKG None  Radiology No  results found.  Procedures Procedures   Medications Ordered in ED Medications  sodium chloride 0.9 % bolus 1,000 mL (1,000 mLs Intravenous New Bag/Given 12/13/23 1258)  metoCLOPramide (REGLAN) injection 10 mg (10 mg Intravenous Given 12/13/23 1258)  potassium chloride SA (KLOR-CON M) CR tablet 40 mEq (40 mEq Oral Given 12/13/23 1433)    ED Course/ Medical Decision Making/ A&P  Medical Decision Making Amount and/or Complexity of Data Reviewed Labs: ordered.  Risk Prescription drug management.   33 year old female presents for evaluation of nausea and vomiting.  Please see HPI for further details.  On examination patient is afebrile and nontachycardic.  Her lung sounds are clear bilaterally, she is not hypoxic.  Abdomen is soft and compressible with no tenderness noted.  Neurological examinations baseline.  Patient is currently 6 weeks.  She states.  Assumed patient nausea and vomiting secondary to her being pregnant.  Will collect labs, provide Reglan and bolus of fluid.  She reports she is currently being seen by prenatal care, OB/GYN.  Denies abdominal pain, vaginal bleeding or spotting so see no need for imaging today.  CBC without leukocytosis or anemia.  Lipase WNL.  CMP with sodium 133, potassium 3.3, no other electrolyte derangement.  Patient potassium repleted with 40 mEq oral potassium.  Patient was able to pass p.o. fluid challenge as well.  Patient was provided with Reglan and 1 L of fluid.  On reassessment, patient reports her nausea has decreased.  Will send patient home with prescription for Reglan and have her follow-up with OB/GYN.  Have encouraged her to return to the ED with any new or worsening symptoms and she voiced understanding.  Stable to discharge home.     Final Clinical Impression(s) / ED Diagnoses Final diagnoses:  Nausea    Rx / DC Orders ED Discharge Orders          Ordered    metoCLOPramide (REGLAN) 10 MG tablet  Every 6 hours PRN        12/13/23  1337              Al Decant, PA-C 12/13/23 1437    Coral Spikes, DO 12/13/23 1505

## 2023-12-13 NOTE — ED Triage Notes (Signed)
 States she is 6 weeks preg and has had n/v was given zofran but it is not working last dose yesterday am at 11 am

## 2023-12-14 ENCOUNTER — Telehealth: Payer: Self-pay

## 2023-12-14 NOTE — Transitions of Care (Post Inpatient/ED Visit) (Signed)
   12/14/2023  Name: Vicki Hull MRN: [id number] DOB: Mar 01, 1991  Today's TOC FU Call Status: Today's TOC FU Call Status:: Unsuccessful Call (1st Attempt) Unsuccessful Call (1st Attempt) Date: 12/14/23  Attempted to reach the patient regarding the most recent Inpatient/ED visit.  Follow Up Plan: Additional outreach attempts will be made to reach the patient to complete the Transitions of Care (Post Inpatient/ED visit) call.

## 2024-01-18 ENCOUNTER — Encounter (INDEPENDENT_AMBULATORY_CARE_PROVIDER_SITE_OTHER): Payer: Self-pay

## 2024-01-31 ENCOUNTER — Encounter (INDEPENDENT_AMBULATORY_CARE_PROVIDER_SITE_OTHER): Payer: Self-pay

## 2024-07-06 NOTE — Telephone Encounter (Signed)
 Last Office Visit 2/25 Next Office Visit 10/25 Refilled per protocol Vicki Hull, CMA

## 2024-07-25 ENCOUNTER — Other Ambulatory Visit: Payer: Self-pay

## 2024-07-25 ENCOUNTER — Emergency Department (HOSPITAL_BASED_OUTPATIENT_CLINIC_OR_DEPARTMENT_OTHER)

## 2024-07-25 ENCOUNTER — Emergency Department (HOSPITAL_BASED_OUTPATIENT_CLINIC_OR_DEPARTMENT_OTHER): Admission: EM | Admit: 2024-07-25 | Discharge: 2024-07-25 | Disposition: A | Discharge status: Home / Self Care

## 2024-07-25 ENCOUNTER — Encounter (HOSPITAL_BASED_OUTPATIENT_CLINIC_OR_DEPARTMENT_OTHER): Payer: Self-pay

## 2024-07-25 DIAGNOSIS — R1013 Epigastric pain: Secondary | ICD-10-CM | POA: Diagnosis not present

## 2024-07-25 DIAGNOSIS — R110 Nausea: Secondary | ICD-10-CM | POA: Diagnosis not present

## 2024-07-25 DIAGNOSIS — R1011 Right upper quadrant pain: Secondary | ICD-10-CM | POA: Diagnosis not present

## 2024-07-25 LAB — CBC WITH DIFFERENTIAL/PLATELET
Abs Immature Granulocytes: 0.01 K/uL (ref 0.00–0.07)
Basophils Absolute: 0 K/uL (ref 0.0–0.1)
Basophils Relative: 1 %
Eosinophils Absolute: 0 K/uL (ref 0.0–0.5)
Eosinophils Relative: 1 %
HCT: 40 % (ref 36.0–46.0)
Hemoglobin: 14.1 g/dL (ref 12.0–15.0)
Immature Granulocytes: 0 %
Lymphocytes Relative: 48 %
Lymphs Abs: 3.3 K/uL (ref 0.7–4.0)
MCH: 34 pg (ref 26.0–34.0)
MCHC: 35.3 g/dL (ref 30.0–36.0)
MCV: 96.4 fL (ref 80.0–100.0)
Monocytes Absolute: 0.5 K/uL (ref 0.1–1.0)
Monocytes Relative: 7 %
Neutro Abs: 2.9 K/uL (ref 1.7–7.7)
Neutrophils Relative %: 43 %
Platelets: 272 K/uL (ref 150–400)
RBC: 4.15 MIL/uL (ref 3.87–5.11)
RDW: 12.7 % (ref 11.5–15.5)
WBC: 6.8 K/uL (ref 4.0–10.5)
nRBC: 0 % (ref 0.0–0.2)

## 2024-07-25 LAB — LIPASE, BLOOD: Lipase: 27 U/L (ref 11–51)

## 2024-07-25 LAB — COMPREHENSIVE METABOLIC PANEL WITH GFR
ALT: 15 U/L (ref 0–44)
AST: 17 U/L (ref 15–41)
Albumin: 4.3 g/dL (ref 3.5–5.0)
Alkaline Phosphatase: 57 U/L (ref 38–126)
Anion gap: 10 (ref 5–15)
BUN: 8 mg/dL (ref 6–20)
CO2: 24 mmol/L (ref 22–32)
Calcium: 9.2 mg/dL (ref 8.9–10.3)
Chloride: 105 mmol/L (ref 98–111)
Creatinine, Ser: 0.8 mg/dL (ref 0.44–1.00)
GFR, Estimated: >60 mL/min (ref >60)
Glucose, Bld: 96 mg/dL (ref 70–99)
Potassium: 4 mmol/L (ref 3.5–5.1)
Sodium: 139 mmol/L (ref 135–145)
Total Bilirubin: 0.4 mg/dL (ref 0.0–1.2)
Total Protein: 7 g/dL (ref 6.5–8.1)

## 2024-07-25 LAB — URINALYSIS, ROUTINE W REFLEX MICROSCOPIC
Bilirubin Urine: NEGATIVE
Glucose, UA: NEGATIVE mg/dL
Ketones, ur: NEGATIVE mg/dL
Leukocytes,Ua: NEGATIVE
Nitrite: NEGATIVE
Protein, ur: NEGATIVE mg/dL
Specific Gravity, Urine: 1.02 (ref 1.005–1.030)
pH: 6.5 (ref 5.0–8.0)

## 2024-07-25 LAB — URINALYSIS, MICROSCOPIC (REFLEX)

## 2024-07-25 LAB — PREGNANCY, URINE: Preg Test, Ur: NEGATIVE

## 2024-07-25 MED ORDER — SODIUM CHLORIDE 0.9 % IV BOLUS
1000.0000 mL | Freq: Once | INTRAVENOUS | Status: AC
  Administered 2024-07-25: 1000 mL via INTRAVENOUS

## 2024-07-25 MED ORDER — ONDANSETRON HCL 4 MG/2ML IJ SOLN
4.0000 mg | Freq: Once | INTRAMUSCULAR | Status: AC
  Administered 2024-07-25: 4 mg via INTRAVENOUS
  Filled 2024-07-25: qty 2

## 2024-07-25 MED ORDER — FAMOTIDINE 20 MG PO TABS: 20.0000 mg | ORAL_TABLET | Freq: Two times a day (BID) | ORAL | 0 refills | Status: DC

## 2024-07-25 NOTE — ED Triage Notes (Signed)
 Reports RUQ abd pain, nausea, weakness for 5 days.  Denies emesis, diarrhea, constipation or urinary symptoms

## 2024-07-25 NOTE — ED Notes (Signed)
 Pt alert and oriented X 4 at the time of discharge. RR even and unlabored. No acute distress noted. Pt verbalized understanding of discharge instructions as discussed. Pt ambulatory to lobby at time of discharge.

## 2024-07-25 NOTE — Discharge Instructions (Addendum)
 I have started you on Pepcid .  We can see if this helps with some some of your nausea as well as pain.  Please eat a low-fat diet.  Avoid fatty foods.  I think following up with gastroenterology makes it most sense as well.  Please give them a call and establish care.  If you have any blood in your vomit or stool then come back to ED for further evaluation

## 2024-07-25 NOTE — ED Provider Notes (Signed)
 [place] [facility] Provider Note   CSN: [id number] Arrival date & time: 07/25/24  0840     Patient presents with: Abdominal Pain   Vicki Hull is a 33 y.o. female.    Abdominal Pain  Patient presents because of right upper quadrant abdominal pain.  Patient is been having feeling nauseous here recently as well.  No obvious alleviating or exacerbating factors.  Does not feel like food necessarily makes it worse.  Patient states that just more kind of a sharp pain in his right upper quadrant.  Endorses nausea but no vomiting.  Bowel moods been regular.  No hematochezia.  No hematemesis.  No previous abdominal surgeries.  No dysuria.  No flank pain.  No hematuria.      Previous medical history reviewed : Patient was last seen in the ED in March 2025.  Was seen because of nausea.  Unremarkable workup at that time.   Prior to Admission medications   Medication Sig Start Date End Date Taking? Authorizing Provider  famotidine  (PEPCID ) 20 MG tablet Take 1 tablet (20 mg total) by mouth 2 (two) times daily. 07/25/24  Yes Simon Lavonia SAILOR, MD  clobetasol  ointment (TEMOVATE ) 0.05 % APPLY TOPICALLY TO THE AFFECTED AREA TWICE DAILY 05/05/22   Thedora Garnette HERO, MD  cyclobenzaprine  (FLEXERIL ) 10 MG tablet Take 1 tablet (10 mg total) by mouth 2 (two) times daily as needed for muscle spasms. 08/03/18   Nivia Colon, PA-C  ibuprofen  (ADVIL ,MOTRIN ) 600 MG tablet Take 1 tablet (600 mg total) by mouth every 6 (six) hours as needed. 08/03/18   Nivia Colon, PA-C  metoCLOPramide  (REGLAN ) 10 MG tablet Take 1 tablet (10 mg total) by mouth every 6 (six) hours as needed for nausea or vomiting. 12/13/23   Ruthell Lonni FALCON, PA-C  potassium chloride  SA (K-DUR) 20 MEQ tablet Take 1 tablet (20 mEq total) by mouth 2 (two) times daily for 3 days. 02/04/19 11/19/21  Freddi Hamilton, MD  predniSONE  (STERAPRED UNI-PAK 21 TAB) 10 MG (21) TBPK tablet Take as advised on pack 01/14/22    Sebastian Beverley NOVAK, MD    Allergies: Patient has no known allergies.    Review of Systems  Gastrointestinal:  Positive for abdominal pain.    Updated Vital Signs BP 126/68   Pulse 64   Temp 98.2 F (36.8 C) (Oral)   Resp 16   LMP 07/04/2024 (Exact Date)   SpO2 100%   Physical Exam Vitals and nursing note reviewed.  Constitutional:      General: She is not in acute distress.    Appearance: She is well-developed.  HENT:     Head: Normocephalic and atraumatic.  Eyes:     Conjunctiva/sclera: Conjunctivae normal.  Cardiovascular:     Rate and Rhythm: Normal rate and regular rhythm.     Heart sounds: No murmur heard. Pulmonary:     Effort: Pulmonary effort is normal. No respiratory distress.     Breath sounds: Normal breath sounds.  Abdominal:     Palpations: Abdomen is soft.     Tenderness: There is no abdominal tenderness.  Musculoskeletal:        General: No swelling.     Cervical back: Neck supple.  Skin:    General: Skin is warm and dry.     Capillary Refill: Capillary refill takes less than 2 seconds.  Neurological:     Mental Status: She is alert.  Psychiatric:        Mood and  Affect: Mood normal.     (all labs ordered are listed, but only abnormal results are displayed) Labs Reviewed  URINALYSIS, ROUTINE W REFLEX MICROSCOPIC - Abnormal; Notable for the following components:      Result Value   Hgb urine dipstick TRACE (*)    All other components within normal limits  URINALYSIS, MICROSCOPIC (REFLEX) - Abnormal; Notable for the following components:   Bacteria, UA RARE (*)    All other components within normal limits  PREGNANCY, URINE  CBC WITH DIFFERENTIAL/PLATELET  COMPREHENSIVE METABOLIC PANEL WITH GFR  LIPASE, BLOOD    EKG: None  Radiology: US  Abdomen Limited RUQ (LIVER/GB) Result Date: 07/25/2024 EXAM: Right Upper Quadrant Abdominal Ultrasound TECHNIQUE: Real-time ultrasonography of the right upper quadrant of the abdomen was performed.  COMPARISON: None. CLINICAL HISTORY: [id number] Abdominal pain 644753. RUQ pain x5 days Abdominal pain 644753. RUQ pain x5 days FINDINGS: LIVER: The liver demonstrates normal echogenicity. No intrahepatic biliary ductal dilatation. No mass. BILIARY SYSTEM: No evidence of pericholecystic fluid or wall thickening. No cholelithiasis. Negative sonographic Murphy's sign. Common bile duct is within normal limits RIGHT KIDNEY: The right kidney is grossly unremarkable in appearances without evidence of hydronephrosis, echogenic calculi or worrisome mass lesions. PANCREAS: Visualized portions of the pancreas are unremarkable. OTHER: No right upper quadrant ascites. IMPRESSION: 1. Unremarkable right upper quadrant ultrasound. Electronically signed by: Lynwood Seip MD 07/25/2024 10:28 AM EDT RP Workstation: [id number]     Procedures   Medications Ordered in the ED  sodium chloride  0.9 % bolus 1,000 mL (0 mLs Intravenous Stopped 07/25/24 1126)  ondansetron  (ZOFRAN ) injection 4 mg (4 mg Intravenous Given 07/25/24 0932)                                    Medical Decision Making Amount and/or Complexity of Data Reviewed Labs: ordered. Radiology: ordered.  Risk Prescription drug management.     HPI:   Patient presents because of right upper quadrant abdominal pain.  Patient is been having feeling nauseous here recently as well.  No obvious alleviating or exacerbating factors.  Does not feel like food necessarily makes it worse.  Patient states that just more kind of a sharp pain in his right upper quadrant.  Endorses nausea but no vomiting.  Bowel moods been regular.  No hematochezia.  No hematemesis.  No previous abdominal surgeries.  No dysuria.  No flank pain.  No hematuria.      Previous medical history reviewed : Patient was last seen in the ED in March 2025.  Was seen because of nausea.  Unremarkable workup at that time.  MDM:    Upon exam, patient Impeklo  stable.  Afebrile.  Maps appropriate.   No tachycardia   Patient has slight pain to palpation of right upper quadrant.  No significant rebound or guarding.  Concern for pathology such as pancreatitis versus cholelithiasis versus choledocholithiasis versus GERD versus gastritis.  Obtain laboratory workup including lipase and CMP to check for liver enzyme abnormality.  Obtain an right upper quad ultrasound to assess for any, cholelithiasis.   Reassessment: Laboratory workup unremarkable.  Lipase benign.  LFTs benign.  Negative pregnancy test.  Also obtained right upper quad ultrasound.  No evidence of cholelithiasis or cholecystitis   Do wonder if this could be possibly be GERD.  Patient is feeling better upon examination. this going soft and benign abdomen.  Tolerant p.o.  Started patient on famotidine .  Referred  to gastroenterology.   No chest pain or shortness of breath.  No concerns for any kind of cardiopulmonary process at this time.      I have independently interpreted the u/s images and agree with the radiologist finding   Social Determinant of Health: None     Disposition and Follow Up: Gastroenterology       Final diagnoses:  Epigastric pain  Nausea    ED Discharge Orders          Ordered    famotidine  (PEPCID ) 20 MG tablet  2 times daily        07/25/24 1119               Simon Lavonia SAILOR, MD 07/25/24 1208

## 2024-09-10 ENCOUNTER — Encounter (HOSPITAL_BASED_OUTPATIENT_CLINIC_OR_DEPARTMENT_OTHER): Payer: Self-pay

## 2024-09-10 ENCOUNTER — Other Ambulatory Visit: Payer: Self-pay

## 2024-09-10 ENCOUNTER — Emergency Department (HOSPITAL_BASED_OUTPATIENT_CLINIC_OR_DEPARTMENT_OTHER)

## 2024-09-10 ENCOUNTER — Emergency Department (HOSPITAL_BASED_OUTPATIENT_CLINIC_OR_DEPARTMENT_OTHER)
Admission: EM | Admit: 2024-09-10 | Discharge: 2024-09-10 | Disposition: A | Discharge status: Home / Self Care | Attending: Emergency Medicine | Admitting: Emergency Medicine

## 2024-09-10 DIAGNOSIS — Z3A01 Less than 8 weeks gestation of pregnancy: Secondary | ICD-10-CM | POA: Diagnosis not present

## 2024-09-10 DIAGNOSIS — O209 Hemorrhage in early pregnancy, unspecified: Secondary | ICD-10-CM | POA: Diagnosis present

## 2024-09-10 DIAGNOSIS — O4690 Antepartum hemorrhage, unspecified, unspecified trimester: Secondary | ICD-10-CM

## 2024-09-10 LAB — URINALYSIS, ROUTINE W REFLEX MICROSCOPIC
Bilirubin Urine: NEGATIVE
Glucose, UA: NEGATIVE mg/dL
Ketones, ur: NEGATIVE mg/dL
Leukocytes,Ua: NEGATIVE
Nitrite: NEGATIVE
Protein, ur: NEGATIVE mg/dL
Specific Gravity, Urine: 1.015 (ref 1.005–1.030)
pH: 6.5 (ref 5.0–8.0)

## 2024-09-10 LAB — CBC WITH DIFFERENTIAL/PLATELET
Abs Immature Granulocytes: 0.02 K/uL (ref 0.00–0.07)
Basophils Absolute: 0 K/uL (ref 0.0–0.1)
Basophils Relative: 0 %
Eosinophils Absolute: 0 K/uL (ref 0.0–0.5)
Eosinophils Relative: 1 %
HCT: 39 % (ref 36.0–46.0)
Hemoglobin: 13.7 g/dL (ref 12.0–15.0)
Immature Granulocytes: 0 %
Lymphocytes Relative: 40 %
Lymphs Abs: 2.8 K/uL (ref 0.7–4.0)
MCH: 33.4 pg (ref 26.0–34.0)
MCHC: 35.1 g/dL (ref 30.0–36.0)
MCV: 95.1 fL (ref 80.0–100.0)
Monocytes Absolute: 0.5 K/uL (ref 0.1–1.0)
Monocytes Relative: 7 %
Neutro Abs: 3.7 K/uL (ref 1.7–7.7)
Neutrophils Relative %: 52 %
Platelets: 312 K/uL (ref 150–400)
RBC: 4.1 MIL/uL (ref 3.87–5.11)
RDW: 12.2 % (ref 11.5–15.5)
WBC: 7.2 K/uL (ref 4.0–10.5)
nRBC: 0 % (ref 0.0–0.2)

## 2024-09-10 LAB — HCG, QUANTITATIVE, PREGNANCY: hCG, Beta Chain, Quant, S: 24050 m[IU]/mL — ABNORMAL HIGH (ref <5)

## 2024-09-10 LAB — URINALYSIS, MICROSCOPIC (REFLEX)

## 2024-09-10 MED ORDER — METRONIDAZOLE 500 MG PO TABS: 500.0000 mg | ORAL_TABLET | Freq: Two times a day (BID) | ORAL | 0 refills | Status: DC

## 2024-09-10 MED ORDER — ACETAMINOPHEN 500 MG PO TABS
1000.0000 mg | ORAL_TABLET | Freq: Once | ORAL | Status: AC
  Administered 2024-09-10: 1000 mg via ORAL
  Filled 2024-09-10: qty 2

## 2024-09-10 MED ORDER — CEPHALEXIN 250 MG PO CAPS
500.0000 mg | ORAL_CAPSULE | Freq: Once | ORAL | Status: AC
  Administered 2024-09-10: 500 mg via ORAL
  Filled 2024-09-10: qty 2

## 2024-09-10 MED ORDER — VALACYCLOVIR HCL 1 G PO TABS: 1000.0000 mg | ORAL_TABLET | Freq: Every day | ORAL | 0 refills | Status: DC

## 2024-09-10 MED ORDER — CEPHALEXIN 500 MG PO CAPS: 500.0000 mg | ORAL_CAPSULE | Freq: Two times a day (BID) | ORAL | 0 refills | Status: AC

## 2024-09-10 NOTE — ED Notes (Signed)

## 2024-09-10 NOTE — ED Provider Notes (Signed)
 [facility] Provider Note   CSN: [id number] Arrival date & time: 09/10/24  1011     Patient presents with: Vaginal Bleeding (pregnant)   Vicki Hull is a 33 y.o. female who presents with concern for vaginal bleeding that started yesterday evening.  She reports she felt like she needed to use the restroom, and when she urinated, she had a large amount of blood falling to the toilet.  She denies any passage of clots.  Reports some lower abdominal cramping.  No fever or chills.  Denies any urinary symptoms such as dysuria or increased frequency.      Vaginal Bleeding      Prior to Admission medications   Medication Sig Start Date End Date Taking? Authorizing Provider  cephALEXin  (KEFLEX ) 500 MG capsule Take 1 capsule (500 mg total) by mouth 2 (two) times daily for 5 days. 09/10/24 09/15/24 Yes Veta Palma, PA-C  clobetasol  ointment (TEMOVATE ) 0.05 % APPLY TOPICALLY TO THE AFFECTED AREA TWICE DAILY 05/05/22   Vicki Garnette HERO, MD  cyclobenzaprine  (FLEXERIL ) 10 MG tablet Take 1 tablet (10 mg total) by mouth 2 (two) times daily as needed for muscle spasms. 08/03/18   Nivia Colon, PA-C  famotidine  (PEPCID ) 20 MG tablet Take 1 tablet (20 mg total) by mouth 2 (two) times daily. 07/25/24   Simon Lavonia SAILOR, MD  ibuprofen  (ADVIL ,MOTRIN ) 600 MG tablet Take 1 tablet (600 mg total) by mouth every 6 (six) hours as needed. 08/03/18   Nivia Colon, PA-C  metoCLOPramide  (REGLAN ) 10 MG tablet Take 1 tablet (10 mg total) by mouth every 6 (six) hours as needed for nausea or vomiting. 12/13/23   Ruthell Lonni FALCON, PA-C  potassium chloride  SA (K-DUR) 20 MEQ tablet Take 1 tablet (20 mEq total) by mouth 2 (two) times daily for 3 days. 02/04/19 11/19/21  Freddi Hamilton, MD  predniSONE  (STERAPRED UNI-PAK 21 TAB) 10 MG (21) TBPK tablet Take as advised on pack 01/14/22   Sebastian Beverley NOVAK, MD    Allergies: Patient has no known allergies.    Review of Systems   Genitourinary:  Positive for vaginal bleeding.    Updated Vital Signs BP 115/72   Pulse 76   Temp 98.5 F (36.9 C) (Oral)   Resp 16   LMP 08/01/2024 (Exact Date)   SpO2 100%   Physical Exam Vitals and nursing note reviewed.  Constitutional:      General: She is not in acute distress.    Appearance: She is well-developed.  HENT:     Head: Normocephalic and atraumatic.  Eyes:     Conjunctiva/sclera: Conjunctivae normal.  Cardiovascular:     Rate and Rhythm: Normal rate and regular rhythm.     Heart sounds: No murmur heard. Pulmonary:     Effort: Pulmonary effort is normal. No respiratory distress.     Breath sounds: Normal breath sounds.  Abdominal:     Palpations: Abdomen is soft.     Tenderness: There is abdominal tenderness.     Comments: Mild lower abdominal tenderness to palpation without rebound or guarding  Musculoskeletal:        General: No swelling.     Cervical back: Neck supple.  Skin:    General: Skin is warm and dry.     Capillary Refill: Capillary refill takes less than 2 seconds.  Neurological:     Mental Status: She is alert.  Psychiatric:        Mood and Affect: Mood normal.     (  all labs ordered are listed, but only abnormal results are displayed) Labs Reviewed  URINALYSIS, ROUTINE W REFLEX MICROSCOPIC - Abnormal; Notable for the following components:      Result Value   Hgb urine dipstick SMALL (*)    All other components within normal limits  HCG, QUANTITATIVE, PREGNANCY - Abnormal; Notable for the following components:   hCG, Beta Chain, Quant, S 24,050 (*)    All other components within normal limits  URINALYSIS, MICROSCOPIC (REFLEX) - Abnormal; Notable for the following components:   Bacteria, UA MANY (*)    All other components within normal limits  CBC WITH DIFFERENTIAL/PLATELET    EKG: None  Radiology: US  OB Comp < 14 Wks Result Date: 09/10/2024 CLINICAL DATA:  Vaginal bleeding in 1st trimester pregnancy. EXAM: OBSTETRIC <14  WK US  AND TRANSVAGINAL OB US  TECHNIQUE: Both transabdominal and transvaginal ultrasound examinations were performed for complete evaluation of the gestation as well as the maternal uterus, adnexal regions, and pelvic cul-de-sac. Transvaginal technique was performed to assess early pregnancy. COMPARISON:  None Available. FINDINGS: Intrauterine gestational sac: Single Yolk sac:  Visualized. Embryo:  Not Visualized. MSD: 12 mm   6 w   0 Subchorionic hemorrhage:  None visualized. Maternal uterus/adnexae: Both ovaries are unremarkable in appearance. No mass or abnormal free fluid identified. IMPRESSION: Single intrauterine gestational sac measuring 6 weeks 0 days by mean sac diameter. Consider correlation with serial b-hCG levels, and followup ultrasound to assess viability in 10 days. Electronically Signed   By: Norleen DELENA Kil M.D.   On: 09/10/2024 13:49     Procedures   Medications Ordered in the ED  acetaminophen  (TYLENOL ) tablet 1,000 mg (1,000 mg Oral Given 09/10/24 1207)  cephALEXin  (KEFLEX ) capsule 500 mg (500 mg Oral Given 09/10/24 1423)                                    Medical Decision Making Amount and/or Complexity of Data Reviewed Labs: ordered. Radiology: ordered.  Risk OTC drugs. Prescription drug management.     Differential diagnosis includes but is not limited to  inevitable abortion, incomplete abortion, septic abortion, subchorionic hemorrhage, ectopic pregnancy, blood loss anemia, UTI, menstrual bleeding, abnormal uterine bleeding  ED Course:  Upon initial evaluation, patient is well-appearing, no acute distress.  Stable vitals.  Has some mild lower abdominal tenderness to palpation without rebound or guarding.  Labs Ordered: I Ordered, and personally interpreted labs.  The pertinent results include:   CBC within normal limits Urinalysis with some bacteria noted but negative for nitrates or leukocytes.  No red blood cells.   Beta-hCG at 24,050  Imaging Studies  ordered: I ordered imaging studies including transvaginal ultrasound I independently visualized the imaging with scope of interpretation limited to determining acute life threatening conditions related to emergency care. Imaging showed  IMPRESSION:  Single intrauterine gestational sac measuring 6 weeks 0 days by mean  sac diameter. Consider correlation with serial b-hCG levels, and  followup ultrasound to assess viability in 10 days.   I agree with the radiologist interpretation   Medications Given: Keflex   Upon re-evaluation, patient remains well-appearing with stable vitals.  Pain well-controlled.  Her ultrasound reveals a intrauterine pregnancy measuring 6 weeks and 0 days.  No evidence of ectopic pregnancy.  No evidence of miscarriage at this time.  No heartbeat noted at this time, it was recommended that she obtain repeat ultrasound in 10 days to assess viability.  I discussed this with the patient and she verbalized understanding. She states she has an obstetrician she can follow-up with. Patient's urine does have small amount of bacteria noted.  She does not have any symptoms of a urinary tract infection.  However, we will treat for asymptomatic bacteriuria in pregnancy with course of Keflex .  First dose given here today. Patient afebrile, no tachycardia, no leukocytosis, no concern for septic abortion or other systemic infection at this time Very low concern for other acute intra-abdominal pathology such as appendicitis given no significant abdominal tenderness to palpation  Patient is stable and appropriate for discharge home  Impression: Vaginal bleeding in pregnancy  Disposition:  The patient was discharged home with instructions to schedule a follow-up ultrasound with her obstetrician or may present to MAU for this ultrasound in 10 days.  She understands if she develops worsening vaginal bleeding, worsening abdominal pain, fevers, any other new or concerning symptoms, she needs  to go to MAU/woman seen at [facility] for further evaluation.  Take 5-day course of Keflex  as prescribed for asymptomatic bacteriuria in pregnancy. Return precautions given and patient verbalized understanding.  Valacyclovir  and metronidazole  were sent in error to patient's pharmacy.  These were intended for another patient.  I called patient's pharmacy and spoke to PharmD Earnie at [facility] at [address] in [place] and these medications were discontinued.  Patient understands the only medication she is to take is the Keflex .  This chart was dictated using voice recognition software, Dragon. Despite the best efforts of this provider to proofread and correct errors, errors may still occur which can change documentation meaning.       Final diagnoses:  Vaginal bleeding in pregnancy    ED Discharge Orders          Ordered    valACYclovir  (VALTREX ) 1000 MG tablet  Daily,   Status:  Discontinued        09/10/24 1251    metroNIDAZOLE  (FLAGYL ) 500 MG tablet  2 times daily,   Status:  Discontinued        09/10/24 1251    cephALEXin  (KEFLEX ) 500 MG capsule  2 times daily        09/10/24 1424               Veta Palma, PA-C 09/10/24 1531    Elnor Bernarda SQUIBB, DO 09/16/24 2344

## 2024-09-10 NOTE — ED Triage Notes (Signed)
 Approx 6 wks preg.  Reports dark red heavy vaginal bleeding last night. This morning started bright red spotting.  Also reports lower back pain and lower abd pain

## 2024-09-10 NOTE — Discharge Instructions (Addendum)
 Your ultrasound today showed a pregnancy in the uterus which is the correct location.  Your estimated to be 6 weeks and 0 days.  We cannot see a heartbeat at this time, and may be slightly too early.  Please schedule a follow up ultrasound with your OB in 10 days for repeat evaluation to ensure the pregnancy is progressing appropriately. If you cannot get an appointment with your OB, you can go to the [facility]'s center at [facility] for your ultrasound. You do not need an appointment.   Your blood counts are normal today.  You have not lost too much blood.  Your urine showed signs of infection. You have been prescribed Keflex to treat this infection. Take this antibiotic 2 times a day for the next 5 days.  You were given your first dose here today.  Take your next dose this evening.  Take the full course of your antibiotic even if you start feeling better. Antibiotics may cause you to have diarrhea.  Continue taking a daily multivitamin. Schedule an appointment with your OB for further routine pregnancy care.   If you continue to experience vaginal bleeding, begin passing clots, or have any other pregnancy related concerns, please go directly to: [facility] at [facility] [address] [place], [place], [place] [address]  You do not need an appointment.

## 2024-09-12 ENCOUNTER — Other Ambulatory Visit

## 2024-09-12 VITALS — BP 110/69 | HR 79 | Wt 173.1 lb

## 2024-09-12 DIAGNOSIS — O219 Vomiting of pregnancy, unspecified: Secondary | ICD-10-CM

## 2024-09-12 DIAGNOSIS — O4690 Antepartum hemorrhage, unspecified, unspecified trimester: Secondary | ICD-10-CM

## 2024-09-12 MED ORDER — PROMETHAZINE HCL 25 MG PO TABS: 25.0000 mg | ORAL_TABLET | Freq: Four times a day (QID) | ORAL | 0 refills | Status: AC | PRN

## 2024-09-12 MED ORDER — PRENATAL 28-0.8 MG PO TABS: 1.0000 | ORAL_TABLET | Freq: Every day | ORAL | 12 refills | Status: AC

## 2024-09-13 ENCOUNTER — Ambulatory Visit: Payer: Self-pay

## 2024-09-13 LAB — BETA HCG QUANT (REF LAB): hCG Quant: 38001 m[IU]/mL

## 2024-09-13 NOTE — Progress Notes (Signed)
 Patient presents for HCG lab. Patient was sent to the lab to have labs drawn. Patient also c/o nausea and vomiting. Phenergan  25 mg take 1 tablet  daily was sent to her pharmacy. A Rx for prenatal vitamins was also sent to her pharmacy. Amika Tassin l Javaya [place], CMA

## 2024-09-14 ENCOUNTER — Telehealth: Payer: Self-pay

## 2024-09-14 ENCOUNTER — Inpatient Hospital Stay (HOSPITAL_COMMUNITY)
Admission: AD | Admit: 2024-09-14 | Discharge: 2024-09-15 | Disposition: A | Attending: Obstetrics and Gynecology | Admitting: Obstetrics and Gynecology

## 2024-09-14 ENCOUNTER — Inpatient Hospital Stay (HOSPITAL_COMMUNITY)

## 2024-09-14 ENCOUNTER — Encounter (HOSPITAL_COMMUNITY): Payer: Self-pay | Admitting: Obstetrics and Gynecology

## 2024-09-14 DIAGNOSIS — O209 Hemorrhage in early pregnancy, unspecified: Secondary | ICD-10-CM | POA: Diagnosis not present

## 2024-09-14 DIAGNOSIS — Z3A01 Less than 8 weeks gestation of pregnancy: Secondary | ICD-10-CM | POA: Diagnosis not present

## 2024-09-14 DIAGNOSIS — O219 Vomiting of pregnancy, unspecified: Secondary | ICD-10-CM

## 2024-09-14 DIAGNOSIS — Z3491 Encounter for supervision of normal pregnancy, unspecified, first trimester: Secondary | ICD-10-CM

## 2024-09-14 LAB — URINALYSIS, ROUTINE W REFLEX MICROSCOPIC
Bacteria, UA: NONE SEEN
Bilirubin Urine: NEGATIVE
Glucose, UA: NEGATIVE mg/dL
Ketones, ur: 20 mg/dL — AB
Leukocytes,Ua: NEGATIVE
Nitrite: NEGATIVE
Protein, ur: 100 mg/dL — AB
Specific Gravity, Urine: 1.026 (ref 1.005–1.030)
pH: 6 (ref 5.0–8.0)

## 2024-09-14 LAB — CBC
HCT: 39.1 % (ref 36.0–46.0)
Hemoglobin: 14 g/dL (ref 12.0–15.0)
MCH: 33.6 pg (ref 26.0–34.0)
MCHC: 35.8 g/dL (ref 30.0–36.0)
MCV: 93.8 fL (ref 80.0–100.0)
Platelets: 325 K/uL (ref 150–400)
RBC: 4.17 MIL/uL (ref 3.87–5.11)
RDW: 11.9 % (ref 11.5–15.5)
WBC: 8.2 K/uL (ref 4.0–10.5)
nRBC: 0 % (ref 0.0–0.2)

## 2024-09-14 LAB — POCT PREGNANCY, URINE: Preg Test, Ur: POSITIVE — AB

## 2024-09-14 MED ORDER — LACTATED RINGERS IV BOLUS
1000.0000 mL | Freq: Once | INTRAVENOUS | Status: AC
  Administered 2024-09-14: 1000 mL via INTRAVENOUS

## 2024-09-14 MED ORDER — SCOPOLAMINE 1 MG/3DAYS TD PT72
1.0000 | MEDICATED_PATCH | Freq: Once | TRANSDERMAL | Status: DC
  Administered 2024-09-15: 1 mg via TRANSDERMAL
  Filled 2024-09-14: qty 1

## 2024-09-14 MED ORDER — ONDANSETRON HCL 4 MG/2ML IJ SOLN
4.0000 mg | Freq: Once | INTRAMUSCULAR | Status: AC
  Administered 2024-09-14: 4 mg via INTRAVENOUS
  Filled 2024-09-14: qty 2

## 2024-09-14 NOTE — Telephone Encounter (Signed)
 Patient called with complaints of vaginal bleeding, pinkish in color with abdominal cramping.  States she is unable to keep anything down.  Recommended she go to MAU for evaluation.  Erminio DELENA Rumps, RN

## 2024-09-14 NOTE — MAU Note (Signed)
 Vicki Hull is a 33 y.o. at [redacted]w[redacted]d here in MAU reporting:   Pt states she went to the ED  Sunday in [place] due to having N/V, abdominal cramping, light spotting.  Pt states she had a positive pregnancy test there and they did lab work. She said they gave her medicine for a UTI.   Pt states she is still having N/V. In the past 24 hours she has vomited about 20+ times and unable to keep anything down. She has tried Crackers, soup and ginger ale and feels weak.   Pt is also still having lower abdominal and back pain. 8/10 pain.  Pt states she is also bleeding and has changed her pad 7 times. She is currently wearing a panty liner. No clots, no recent intercourse. Bleeding is light pink now but was heavier earlier.   No meds taken.   Vitals:   09/14/24 2147  BP: 122/74  Resp: 17  Temp: 98.9 F (37.2 C)  SpO2: 100%

## 2024-09-14 NOTE — MAU Provider Note (Incomplete)
 S/HPI [name] is a 33 y.o. [id number] patient who presents to MAU today with complaint of patient reports she is about 6 weeks 2 days pregnant with complaint of nausea vomiting, abdominal cramping, and light spotting.  Patient states she had a positive pregnancy test and had recently been to The ED at [facility] with a confirmed pregnancy test and hCG quant of 38,001 and an ultrasound was performed on 09/10/2024 that showed a single IUP with a yolk sac, and no embryo visualized at the time dating approximately 6 weeks a viability ultrasound was recommended in approximately 10 days.    Patient reports today she has had about 20 episodes of vomiting and reports unable to keep anything down and lower abdominal and back pain rating it 8 out of 10 on pain scale.  She is also reporting vaginal bleeding where she had to change her pad approximately 7 times today but denies any heavy current vaginal bleeding no clots and reports no recent sexual relations and offers no complaints of vaginal itching burning or irritation and denies urinary signs or symptoms   The remainder of the ROS is negative unless otherwise noted in HPI above  Patient has not established prenatal care as of yet   O BP 122/74 (BP Location: Right Arm)   Temp 98.9 F (37.2 C) (Oral)   Resp 17   Ht 5' 2 (1.575 m)   Wt 77.5 kg   LMP 08/01/2024 (Approximate)   SpO2 100%   BMI 31.26 kg/m  Physical Exam Vitals and nursing note reviewed.  Constitutional:      General: She is not in acute distress.    Appearance: She is well-developed. She is not ill-appearing.  HENT:     Head: Normocephalic.     Nose: Nose normal.     Mouth/Throat:     Mouth: Mucous membranes are moist.  Cardiovascular:     Rate and Rhythm: Normal rate.  Pulmonary:     Effort: Pulmonary effort is normal.  Abdominal:     Palpations: Abdomen is soft.     Tenderness: There is no abdominal tenderness.  Musculoskeletal:     Cervical  back: Normal range of motion.  Skin:    General: Skin is warm.  Neurological:     Mental Status: She is alert and oriented to person, place, and time.  Psychiatric:        Behavior: Behavior normal.     MDM   HIGH  Vaginal bleeding in early pregnancy/ N/V In pregnacy CBC CMP HCG ABO OB Ultrasound Vaginal swabs UA: Consistent with 100 of proteinuria and 20 of ketones-dehydration in pregnancy likely from the consistent nausea and vomiting IV fluid bolus with Zofran  IV push for nausea and vomiting Scopolamine patch    Differential diagnosis considered for 1st trimester vaginal bleeding includes but is not limited to: ectopic pregnancy, complete spontaneous abortion, incomplete abortion, missed abortion, threatened abortion, embryonic/fetal demise, cervical insufficiency, cervical or vaginal disorder    Orders Placed This Encounter  Procedures  . Wet prep, genital    Standing Status:   Standing    Number of Occurrences:   1  . US  OB Transvaginal    Standing Status:   Standing    Number of Occurrences:   1    Symptom/Reason for Exam:   Vaginal bleeding affecting early pregnancy [[id number]]  . Urinalysis, Routine w reflex microscopic -Urine, Clean Catch    Standing Status:   Standing  Number of Occurrences:   1    Specimen Source:   Urine, Clean Catch [[id number]]  . CBC    Standing Status:   Standing    Number of Occurrences:   1  . hCG, quantitative, pregnancy    Standing Status:   Standing    Number of Occurrences:   1  . Comprehensive metabolic panel    Standing Status:   Standing    Number of Occurrences:   1  . Pregnancy, urine POC    Standing Status:   Standing    Number of Occurrences:   1      Results for orders placed or performed during the hospital encounter of 09/14/24 (from the past 24 hours)  Urinalysis, Routine w reflex microscopic -Urine, Clean Catch     Status: Abnormal   Collection Time: 09/14/24 10:17 PM  Result Value Ref Range   Color, Urine YELLOW  YELLOW   APPearance HAZY (A) CLEAR   Specific Gravity, Urine 1.026 1.005 - 1.030   pH 6.0 5.0 - 8.0   Glucose, UA NEGATIVE NEGATIVE mg/dL   Hgb urine dipstick SMALL (A) NEGATIVE   Bilirubin Urine NEGATIVE NEGATIVE   Ketones, ur 20 (A) NEGATIVE mg/dL   Protein, ur 899 (A) NEGATIVE mg/dL   Nitrite NEGATIVE NEGATIVE   Leukocytes,Ua NEGATIVE NEGATIVE   RBC / HPF 0-5 0 - 5 RBC/hpf   WBC, UA 0-5 0 - 5 WBC/hpf   Bacteria, UA NONE SEEN NONE SEEN   Squamous Epithelial / HPF 6-10 0 - 5 /HPF   Mucus PRESENT   Pregnancy, urine POC     Status: Abnormal   Collection Time: 09/14/24 10:18 PM  Result Value Ref Range   Preg Test, Ur POSITIVE (A) NEGATIVE       I have reviewed the patient chart and performed the physical exam . I have ordered & interpreted the lab results and reviewed and interpreted the *** Medications ordered as stated below.  A/P as described below.  Counseling and education provided and patient agreeable  with plan as described below. Verbalized understanding.    ASSESSMENT Medical screening exam {Blank single:19197::complete,started} @DX @ @DIAGMED @     PLAN No future appointments.   Discharge from MAU in stable condition See AVS for full description of educational information and instructions provided to the patient at time of discharge List of options for follow-up given *** Warning signs for worsening condition that would warrant emergency follow-up discussed Patient may return to MAU as needed   Littie Olam LABOR, NP 09/14/2024 11:35 PM   This chart was dictated using voice recognition software, Dragon. Despite the best efforts of this provider to proofread and correct errors, errors may still occur which can change documentation meaning.

## 2024-09-14 NOTE — MAU Provider Note (Signed)
 S/HPI [name] is a 33 y.o. [id number] patient who presents to MAU today with complaint of patient reports she is about 6 weeks 2 days pregnant with complaint of nausea vomiting, abdominal cramping, and light spotting.  Patient states she had a positive pregnancy test and had recently been to The ED at [facility] with a confirmed pregnancy test and hCG quant of 38,001 and an ultrasound was performed on 09/10/2024 that showed a single IUP with a yolk sac, and no embryo visualized at the time dating approximately 6 weeks a viability ultrasound was recommended in approximately 10 days.    Patient reports today she has had about 20 episodes of vomiting and reports unable to keep anything down and lower abdominal and back pain rating it 8 out of 10 on pain scale.  She is also reporting vaginal bleeding where she had to change her pad approximately 7 times today but denies any heavy current vaginal bleeding no clots and reports no recent sexual relations and offers no complaints of vaginal itching burning or irritation and denies urinary signs or symptoms   The remainder of the ROS is negative unless otherwise noted in HPI above  Patient has not established prenatal care as of yet   O BP 122/74 (BP Location: Right Arm)   Temp 98.9 F (37.2 C) (Oral)   Resp 17   Ht 5' 2 (1.575 m)   Wt 77.5 kg   LMP 08/01/2024 (Approximate)   SpO2 100%   BMI 31.26 kg/m  Physical Exam Vitals and nursing note reviewed. Exam conducted with a chaperone present.  Constitutional:      General: She is not in acute distress.    Appearance: She is well-developed. She is not ill-appearing.  HENT:     Head: Normocephalic.     Nose: Nose normal.     Mouth/Throat:     Mouth: Mucous membranes are moist.  Cardiovascular:     Rate and Rhythm: Normal rate.  Pulmonary:     Effort: Pulmonary effort is normal.  Abdominal:     Palpations: Abdomen is soft.     Tenderness: There is no abdominal  tenderness.  Musculoskeletal:     Cervical back: Normal range of motion.  Skin:    General: Skin is warm.  Neurological:     Mental Status: She is alert and oriented to person, place, and time.  Psychiatric:        Behavior: Behavior normal.     MDM   HIGH  Vaginal bleeding in early pregnancy/ N/V In pregnacy CBC: NM CMP: Lab unable to perform test - blood hemolized HCG: 73,607 OB Ultrasound ( SIUP with FHR) Vaginal swabs: Pending at discharge ( Patient requests notification via My Chart if abnormal) UA: Consistent with 100 of proteinuria and 20 of ketones-dehydration in pregnancy likely from the consistent nausea and vomiting IV fluid bolus with Zofran  IV push for nausea and vomiting Scopolamine  patch  Reassessed at 0020 : Patient reports resolve with N/V and feeling better after fluids OB Ultrasound results reviewed( SIUP with Cardiac activity) Plan for discharge at this time  Differential diagnosis considered for 1st trimester vaginal bleeding includes but is not limited to: ectopic pregnancy, complete spontaneous abortion, incomplete abortion, missed abortion, threatened abortion, embryonic/fetal demise, cervical insufficiency, cervical or vaginal disorder    Orders Placed This Encounter  Procedures   Wet prep, genital    Standing Status:   Standing    Number of Occurrences:  1   US  OB Transvaginal    Standing Status:   Standing    Number of Occurrences:   1    Symptom/Reason for Exam:   Vaginal bleeding affecting early pregnancy [[id number]]   Urinalysis, Routine w reflex microscopic -Urine, Clean Catch    Standing Status:   Standing    Number of Occurrences:   1    Specimen Source:   Urine, Clean Catch [76]   CBC    Standing Status:   Standing    Number of Occurrences:   1   hCG, quantitative, pregnancy    Standing Status:   Standing    Number of Occurrences:   1   Comprehensive metabolic panel    Standing Status:   Standing    Number of Occurrences:   1    Pregnancy, urine POC    Standing Status:   Standing    Number of Occurrences:   1   Discharge patient Discharge disposition: 01-Home or Self Care; Discharge patient date: 09/15/2024    Standing Status:   Standing    Number of Occurrences:   1    Discharge disposition:   01-Home or Self Care [1]    Discharge patient date:   09/15/2024      Results for orders placed or performed during the hospital encounter of 09/14/24 (from the past 24 hours)  Urinalysis, Routine w reflex microscopic -Urine, Clean Catch     Status: Abnormal   Collection Time: 09/14/24 10:17 PM  Result Value Ref Range   Color, Urine YELLOW YELLOW   APPearance HAZY (A) CLEAR   Specific Gravity, Urine 1.026 1.005 - 1.030   pH 6.0 5.0 - 8.0   Glucose, UA NEGATIVE NEGATIVE mg/dL   Hgb urine dipstick SMALL (A) NEGATIVE   Bilirubin Urine NEGATIVE NEGATIVE   Ketones, ur 20 (A) NEGATIVE mg/dL   Protein, ur 899 (A) NEGATIVE mg/dL   Nitrite NEGATIVE NEGATIVE   Leukocytes,Ua NEGATIVE NEGATIVE   RBC / HPF 0-5 0 - 5 RBC/hpf   WBC, UA 0-5 0 - 5 WBC/hpf   Bacteria, UA NONE SEEN NONE SEEN   Squamous Epithelial / HPF 6-10 0 - 5 /HPF   Mucus PRESENT   Pregnancy, urine POC     Status: Abnormal   Collection Time: 09/14/24 10:18 PM  Result Value Ref Range   Preg Test, Ur POSITIVE (A) NEGATIVE  CBC     Status: None   Collection Time: 09/14/24 10:51 PM  Result Value Ref Range   WBC 8.2 4.0 - 10.5 K/uL   RBC 4.17 3.87 - 5.11 MIL/uL   Hemoglobin 14.0 12.0 - 15.0 g/dL   HCT 60.8 63.9 - 53.9 %   MCV 93.8 80.0 - 100.0 fL   MCH 33.6 26.0 - 34.0 pg   MCHC 35.8 30.0 - 36.0 g/dL   RDW 88.0 88.4 - 84.4 %   Platelets 325 150 - 400 K/uL   nRBC 0.0 0.0 - 0.2 %       I have reviewed the patient chart and performed the physical exam . I have ordered & interpreted the lab results and reviewed and interpreted independably  the OB Ultrasound images and agree with the radiologist findings  Medications ordered as stated below.  A/P as  described below.  Counseling and education provided and patient agreeable  with plan as described below. Verbalized understanding.    ASSESSMENT Medical screening exam complete  Nausea and vomiting during pregnancy  Vaginal bleeding affecting early  pregnancy  Normal intrauterine pregnancy on prenatal ultrasound in first trimester     PLAN  Establish Prenatal Care  List of providers given   Discharge from MAU in stable condition  See AVS for full description of educational information and instructions provided to the patient at time of discharge  Warning signs for worsening condition that would warrant emergency follow-up discussed  Patient may return to MAU as needed  Allergies as of 09/15/2024   No Known Allergies      Medication List     STOP taking these medications    clobetasol  ointment 0.05 % Commonly known as: TEMOVATE    ibuprofen  600 MG tablet Commonly known as: ADVIL    predniSONE  10 MG (21) Tbpk tablet Commonly known as: STERAPRED UNI-PAK 21 TAB       TAKE these medications    cephALEXin  500 MG capsule Commonly known as: KEFLEX  Take 1 capsule (500 mg total) by mouth 2 (two) times daily for 5 days.   cyclobenzaprine  10 MG tablet Commonly known as: FLEXERIL  Take 1 tablet (10 mg total) by mouth 2 (two) times daily as needed for muscle spasms.   famotidine  20 MG tablet Commonly known as: PEPCID  Take 1 tablet (20 mg total) by mouth 2 (two) times daily.   metoCLOPramide  10 MG tablet Commonly known as: REGLAN  Take 1 tablet (10 mg total) by mouth every 6 (six) hours as needed for nausea or vomiting.   ondansetron  4 MG tablet Commonly known as: Zofran  Take 2 tablets (8 mg total) by mouth 2 (two) times daily.   potassium chloride  SA 20 MEQ tablet Commonly known as: KLOR-CON  M Take 1 tablet (20 mEq total) by mouth 2 (two) times daily for 3 days.   Prenatal 28-0.8 MG Tabs Take 1 tablet by mouth daily.   promethazine  25 MG tablet Commonly known  as: PHENERGAN  Take 1 tablet (25 mg total) by mouth every 6 (six) hours as needed.   scopolamine  1 MG/3DAYS Commonly known as: TRANSDERM-SCOP Place 1 patch (1 mg total) onto the skin every 3 (three) days.        Littie Olam LABOR, NP 09/15/2024 12:24 AM   This chart was dictated using voice recognition software, Dragon. Despite the best efforts of this provider to proofread and correct errors, errors may still occur which can change documentation meaning.

## 2024-09-15 DIAGNOSIS — O209 Hemorrhage in early pregnancy, unspecified: Secondary | ICD-10-CM

## 2024-09-15 DIAGNOSIS — O219 Vomiting of pregnancy, unspecified: Secondary | ICD-10-CM

## 2024-09-15 DIAGNOSIS — Z3A01 Less than 8 weeks gestation of pregnancy: Secondary | ICD-10-CM | POA: Diagnosis not present

## 2024-09-15 LAB — GC/CHLAMYDIA PROBE AMP (~~LOC~~) NOT AT ARMC
Chlamydia: NEGATIVE
Comment: NEGATIVE
Comment: NORMAL
Neisseria Gonorrhea: NEGATIVE

## 2024-09-15 LAB — WET PREP, GENITAL
Sperm: NONE SEEN
Trich, Wet Prep: NONE SEEN
WBC, Wet Prep HPF POC: <10 (ref <10)
Yeast Wet Prep HPF POC: NONE SEEN

## 2024-09-15 LAB — HCG, QUANTITATIVE, PREGNANCY: hCG, Beta Chain, Quant, S: 73607 m[IU]/mL — ABNORMAL HIGH (ref <5)

## 2024-09-15 MED ORDER — SCOPOLAMINE 1 MG/3DAYS TD PT72: 1.0000 | MEDICATED_PATCH | TRANSDERMAL | 0 refills | Status: AC

## 2024-09-15 MED ORDER — ONDANSETRON HCL 4 MG PO TABS: 8.0000 mg | ORAL_TABLET | Freq: Two times a day (BID) | ORAL | 0 refills | Status: DC

## 2024-09-15 NOTE — Discharge Instructions (Signed)

## 2024-09-16 ENCOUNTER — Other Ambulatory Visit: Payer: Self-pay

## 2024-09-16 ENCOUNTER — Inpatient Hospital Stay (HOSPITAL_COMMUNITY)
Admission: AD | Admit: 2024-09-16 | Discharge: 2024-09-16 | Disposition: A | Discharge status: Home / Self Care | Attending: Obstetrics and Gynecology | Admitting: Obstetrics and Gynecology

## 2024-09-16 ENCOUNTER — Encounter (HOSPITAL_COMMUNITY): Payer: Self-pay | Admitting: Obstetrics and Gynecology

## 2024-09-16 DIAGNOSIS — O219 Vomiting of pregnancy, unspecified: Secondary | ICD-10-CM

## 2024-09-16 DIAGNOSIS — Z3A01 Less than 8 weeks gestation of pregnancy: Secondary | ICD-10-CM | POA: Diagnosis not present

## 2024-09-16 LAB — URINALYSIS, ROUTINE W REFLEX MICROSCOPIC
Bacteria, UA: NONE SEEN
Bilirubin Urine: NEGATIVE
Glucose, UA: NEGATIVE mg/dL
Hgb urine dipstick: NEGATIVE
Ketones, ur: 5 mg/dL — AB
Leukocytes,Ua: NEGATIVE
Nitrite: NEGATIVE
Protein, ur: 30 mg/dL — AB
Specific Gravity, Urine: 1.019 (ref 1.005–1.030)
pH: 6 (ref 5.0–8.0)

## 2024-09-16 LAB — CBC WITH DIFFERENTIAL/PLATELET
Abs Immature Granulocytes: 0.03 K/uL (ref 0.00–0.07)
Basophils Absolute: 0 K/uL (ref 0.0–0.1)
Basophils Relative: 0 %
Eosinophils Absolute: 0 K/uL (ref 0.0–0.5)
Eosinophils Relative: 0 %
HCT: 39.3 % (ref 36.0–46.0)
Hemoglobin: 13.9 g/dL (ref 12.0–15.0)
Immature Granulocytes: 0 %
Lymphocytes Relative: 39 %
Lymphs Abs: 3 K/uL (ref 0.7–4.0)
MCH: 33.2 pg (ref 26.0–34.0)
MCHC: 35.4 g/dL (ref 30.0–36.0)
MCV: 93.8 fL (ref 80.0–100.0)
Monocytes Absolute: 0.6 K/uL (ref 0.1–1.0)
Monocytes Relative: 8 %
Neutro Abs: 4.1 K/uL (ref 1.7–7.7)
Neutrophils Relative %: 53 %
Platelets: 325 K/uL (ref 150–400)
RBC: 4.19 MIL/uL (ref 3.87–5.11)
RDW: 11.4 % — ABNORMAL LOW (ref 11.5–15.5)
WBC: 7.8 K/uL (ref 4.0–10.5)
nRBC: 0 % (ref 0.0–0.2)

## 2024-09-16 LAB — COMPREHENSIVE METABOLIC PANEL WITH GFR
ALT: 13 U/L (ref 0–44)
AST: 13 U/L — ABNORMAL LOW (ref 15–41)
Albumin: 3.6 g/dL (ref 3.5–5.0)
Alkaline Phosphatase: 47 U/L (ref 38–126)
Anion gap: 8 (ref 5–15)
BUN: 6 mg/dL (ref 6–20)
CO2: 22 mmol/L (ref 22–32)
Calcium: 9.1 mg/dL (ref 8.9–10.3)
Chloride: 102 mmol/L (ref 98–111)
Creatinine, Ser: 0.61 mg/dL (ref 0.44–1.00)
GFR, Estimated: >60 mL/min (ref >60)
Glucose, Bld: 92 mg/dL (ref 70–99)
Potassium: 3.5 mmol/L (ref 3.5–5.1)
Sodium: 132 mmol/L — ABNORMAL LOW (ref 135–145)
Total Bilirubin: 0.5 mg/dL (ref 0.0–1.2)
Total Protein: 7 g/dL (ref 6.5–8.1)

## 2024-09-16 MED ORDER — LACTATED RINGERS IV BOLUS
1000.0000 mL | Freq: Once | INTRAVENOUS | Status: AC
  Administered 2024-09-16: 1000 mL via INTRAVENOUS

## 2024-09-16 MED ORDER — ACETAMINOPHEN 10 MG/ML IV SOLN
1000.0000 mg | Freq: Once | INTRAVENOUS | Status: AC
  Administered 2024-09-16: 1000 mg via INTRAVENOUS
  Filled 2024-09-16: qty 100

## 2024-09-16 MED ORDER — ONDANSETRON 4 MG PO TBDP: 4.0000 mg | ORAL_TABLET | Freq: Four times a day (QID) | ORAL | 2 refills | Status: AC | PRN

## 2024-09-16 MED ORDER — DOXYLAMINE-PYRIDOXINE 10-10 MG PO TBEC: 2.0000 | DELAYED_RELEASE_TABLET | Freq: Every day | ORAL | 2 refills | Status: AC

## 2024-09-16 MED ORDER — METOCLOPRAMIDE HCL 5 MG/ML IJ SOLN
10.0000 mg | Freq: Once | INTRAMUSCULAR | Status: AC
  Administered 2024-09-16: 10 mg via INTRAVENOUS
  Filled 2024-09-16: qty 2

## 2024-09-16 MED ORDER — METOCLOPRAMIDE HCL 10 MG PO TABS: 10.0000 mg | ORAL_TABLET | Freq: Three times a day (TID) | ORAL | 2 refills | Status: AC

## 2024-09-16 MED ORDER — ONDANSETRON HCL 4 MG/2ML IJ SOLN
4.0000 mg | Freq: Four times a day (QID) | INTRAMUSCULAR | Status: DC | PRN
  Administered 2024-09-16: 4 mg via INTRAVENOUS
  Filled 2024-09-16: qty 2

## 2024-09-16 MED ORDER — FAMOTIDINE IN NACL 20-0.9 MG/50ML-% IV SOLN
20.0000 mg | Freq: Once | INTRAVENOUS | Status: AC
  Administered 2024-09-16: 20 mg via INTRAVENOUS
  Filled 2024-09-16: qty 50

## 2024-09-16 MED ORDER — SCOPOLAMINE 1 MG/3DAYS TD PT72
1.0000 | MEDICATED_PATCH | TRANSDERMAL | Status: DC
  Administered 2024-09-16: 1 mg via TRANSDERMAL
  Filled 2024-09-16: qty 1

## 2024-09-16 NOTE — MAU Note (Cosign Needed)
 Maternal Assessment Unit Provider Note  Subjective: [name] is a 33 y.o. [phone] pregnant female at [redacted]w[redacted]d who presents to MAU today with complaint of nausea/vomiting.   Patient was here 2 days ago for similar symptoms and vaginal bleeding.  The vaginal bleeding has resolved.  Ultrasound on 12/8 demonstrated viable IUP.  She has been taking Zofran , promethazine , and scopolamine  patch.  Feels like she throws up Zofran  so it is not working and promethazine  knocks me out.She has tried eating multiple bland foods, soups ginger ale and not able to keep down any p.o. intake since yesterday morning.  The vomiting yesterday provoked stomach cramps which has turned into a right lower quadrant/suprapubic stabbing pain.  She took acetaminophen  last night for her pain.  This morning she woke up feeling weak, dizzy, lightheaded.  She threw up a smoothie and grapes she tried to eat and decided to return to the MAU.  Receives care at: NA, not yet established for prenatal care. ER notes reviewed.  No history of hyperemesis gravidarum or severe morning sickness with her prior pregnancies.  No diarrhea.  + Chills, but no fever.  No dysuria.  Pertinent items noted in HPI and remainder of comprehensive ROS otherwise negative.   Objective: BP 121/83 (BP Location: Right Arm)   Pulse 91   Temp 98.5 F (36.9 C) (Oral)   Resp 17   Ht 5' 2 (1.575 m)   Wt 76.3 kg   LMP 08/01/2024 (Approximate)   SpO2 98%   BMI 30.78 kg/m  Physical Exam Vitals reviewed.  Constitutional:      General: She is not in acute distress.    Appearance: She is well-developed. She is not diaphoretic.  Eyes:     General: No scleral icterus. Cardiovascular:     Rate and Rhythm: Normal rate and regular rhythm.     Heart sounds: No murmur heard.    Friction rub present. No gallop.  Pulmonary:     Effort: Pulmonary effort is normal. No respiratory distress.     Breath sounds: Normal breath sounds.  Abdominal:      General: Bowel sounds are normal. There is no distension.     Palpations: Abdomen is soft.     Tenderness: There is abdominal tenderness in the right lower quadrant and suprapubic area. There is no right CVA tenderness, left CVA tenderness, guarding or rebound. Negative signs include Rovsing's sign, McBurney's sign and obturator sign.  Skin:    General: Skin is warm and dry.  Neurological:     General: No focal deficit present.     Mental Status: She is alert.     Coordination: Coordination normal.  Psychiatric:        Mood and Affect: Mood normal.        Behavior: Behavior normal.       MDM: High risk  MAU Course: 1745 Patient triaged.  Vital signs normal.  UA sent for evaluation.  Interviewed patient.  Exam notable for suprapubic and right lower quadrant tenderness, however no peritoneal signs.  Will start IV fluids, nausea medication, labs and observe.  1851 Reassessed patient. Nausea is improved, but still present. Reglan  & famotidine  IV ordered.  2030 Reassessed patient. Feeling much better. Pain improved. Eating crackers.   Questions were answered to the satisfaction of the patient and/or family prior to discharge.   Assessment Medical screening exam complete    ICD-10-CM   1. Nausea/vomiting in pregnancy  O21.9        Plan  Continue to use scopolamine  patches as controller medication Use reglan  (metaclopramide) 3x a day before meals whether you feel like you need it or not as nausea controller medication Take Diclegis  (doxylamine -B6) at bedtime as a nausea controller medication  Take Zofran  (ondansetron ) dissolvable and a rescue medication if the controller medications are not adequately treating your symptoms.  If despite medications, you are having intractable vomiting please return to the MAU.  Discharge from MAU in stable condition with strict return precautions  Establish prenatal care with OB  Allergies as of 09/16/2024   No Known Allergies       Medication List     STOP taking these medications    cyclobenzaprine  10 MG tablet Commonly known as: FLEXERIL    famotidine  20 MG tablet Commonly known as: PEPCID    ondansetron  4 MG tablet Commonly known as: Zofran    potassium chloride  SA 20 MEQ tablet Commonly known as: KLOR-CON  M       TAKE these medications    Doxylamine -Pyridoxine  10-10 MG Tbec Commonly known as: Diclegis  Take 2 tablets by mouth at bedtime.   metoCLOPramide  10 MG tablet Commonly known as: REGLAN  Take 1 tablet (10 mg total) by mouth 3 (three) times daily before meals. What changed:  when to take this reasons to take this   ondansetron  4 MG disintegrating tablet Commonly known as: ZOFRAN -ODT Take 1 tablet (4 mg total) by mouth every 6 (six) hours as needed for nausea.   Prenatal 28-0.8 MG Tabs Take 1 tablet by mouth daily.   promethazine  25 MG tablet Commonly known as: PHENERGAN  Take 1 tablet (25 mg total) by mouth every 6 (six) hours as needed.   scopolamine  1 MG/3DAYS Commonly known as: TRANSDERM-SCOP Place 1 patch (1 mg total) onto the skin every 3 (three) days.        Trudy Leeroy NOVAK, MD 09/16/2024 8:35 PM

## 2024-09-16 NOTE — MAU Note (Signed)
 Vicki Hull is a 33 y.o. at [redacted]w[redacted]d here in MAU reporting: states she does not feel any better since Thursday when she was here last. Feels very weak. Feeling LRQ pain that started Friday that is a constant pain. Has vomited more than 5x in the last 24hrs. Denies any VB, but there is brown discharge. Is not able to keep any liquids or solids down.  Onset of complaint: ongoing Pain score: 9 Vitals:   09/16/24 1654  BP: 121/83  Pulse: 91  Resp: 17  Temp: 98.5 F (36.9 C)  SpO2: 98%     FHT:n/a Lab orders placed from triage:  UA

## 2024-09-16 NOTE — Discharge Instructions (Addendum)
 Vicki Hull, It was nice to meet you and congratulations on your pregnancy!  Here is your plan for treating morning sickness...  Continue to use scopolamine  patches as controller medication Use reglan  (metaclopramide) 3x a day before meals whether you feel like you need it or not as nausea controller medication Take Diclegis  (doxylamine -B6) at bedtime as a nausea controller medication  Take Zofran  (ondansetron ) dissolvable and a rescue medication if the controller medications are not adequately treating your symptoms.  Please schedule your prenatal visit if you have not already. If despite medications, you are having intractable vomiting please return to the MAU.  Take care, Dr. Trudy

## 2024-09-20 ENCOUNTER — Telehealth: Payer: Self-pay

## 2024-09-20 NOTE — Telephone Encounter (Signed)
-----   Message from Harlene LITTIE Duncans sent at 09/13/2024  4:00 PM EST ----- Regarding: Ultrasound Patient needs viability US  ordered and completed prior to NOB intake. Please call and schedule patient accordingly.   Thanks,  Harlene

## 2024-09-20 NOTE — Telephone Encounter (Signed)
 Called patient to schedule new ob intake and new ob provider visit. Was unable to leave a voicemail.

## 2024-09-21 ENCOUNTER — Inpatient Hospital Stay (HOSPITAL_COMMUNITY)
Admit: 2024-09-21 | Discharge: 2024-09-21 | Disposition: A | Payer: Self-pay | Attending: Obstetrics & Gynecology | Admitting: Obstetrics & Gynecology

## 2024-09-21 ENCOUNTER — Inpatient Hospital Stay (HOSPITAL_COMMUNITY)

## 2024-09-21 ENCOUNTER — Other Ambulatory Visit: Payer: Self-pay

## 2024-09-21 DIAGNOSIS — N760 Acute vaginitis: Secondary | ICD-10-CM | POA: Diagnosis not present

## 2024-09-21 DIAGNOSIS — B9689 Other specified bacterial agents as the cause of diseases classified elsewhere: Secondary | ICD-10-CM

## 2024-09-21 DIAGNOSIS — O468X1 Other antepartum hemorrhage, first trimester: Secondary | ICD-10-CM

## 2024-09-21 DIAGNOSIS — O418X10 Other specified disorders of amniotic fluid and membranes, first trimester, not applicable or unspecified: Secondary | ICD-10-CM | POA: Diagnosis not present

## 2024-09-21 DIAGNOSIS — O209 Hemorrhage in early pregnancy, unspecified: Secondary | ICD-10-CM

## 2024-09-21 DIAGNOSIS — Z3A01 Less than 8 weeks gestation of pregnancy: Secondary | ICD-10-CM

## 2024-09-21 DIAGNOSIS — Z3491 Encounter for supervision of normal pregnancy, unspecified, first trimester: Secondary | ICD-10-CM

## 2024-09-21 LAB — COMPREHENSIVE METABOLIC PANEL WITH GFR
ALT: 15 U/L (ref 0–44)
AST: 17 U/L (ref 15–41)
Albumin: 3.6 g/dL (ref 3.5–5.0)
Alkaline Phosphatase: 45 U/L (ref 38–126)
Anion gap: 9 (ref 5–15)
BUN: 8 mg/dL (ref 6–20)
CO2: 24 mmol/L (ref 22–32)
Calcium: 9.4 mg/dL (ref 8.9–10.3)
Chloride: 99 mmol/L (ref 98–111)
Creatinine, Ser: 0.62 mg/dL (ref 0.44–1.00)
GFR, Estimated: >60 mL/min (ref >60)
Glucose, Bld: 87 mg/dL (ref 70–99)
Potassium: 3.8 mmol/L (ref 3.5–5.1)
Sodium: 132 mmol/L — ABNORMAL LOW (ref 135–145)
Total Bilirubin: 0.7 mg/dL (ref 0.0–1.2)
Total Protein: 6.5 g/dL (ref 6.5–8.1)

## 2024-09-21 LAB — CBC
HCT: 37 % (ref 36.0–46.0)
Hemoglobin: 13.5 g/dL (ref 12.0–15.0)
MCH: 34.1 pg — ABNORMAL HIGH (ref 26.0–34.0)
MCHC: 36.5 g/dL — ABNORMAL HIGH (ref 30.0–36.0)
MCV: 93.4 fL (ref 80.0–100.0)
Platelets: 291 K/uL (ref 150–400)
RBC: 3.96 MIL/uL (ref 3.87–5.11)
RDW: 11.2 % — ABNORMAL LOW (ref 11.5–15.5)
WBC: 6.3 K/uL (ref 4.0–10.5)
nRBC: 0 % (ref 0.0–0.2)

## 2024-09-21 LAB — HCG, QUANTITATIVE, PREGNANCY: hCG, Beta Chain, Quant, S: 207207 m[IU]/mL — ABNORMAL HIGH (ref <5)

## 2024-09-21 MED ORDER — METRONIDAZOLE 500 MG PO TABS: 500.0000 mg | ORAL_TABLET | Freq: Two times a day (BID) | ORAL | 0 refills | Status: AC

## 2024-09-21 NOTE — MAU Provider Note (Signed)
 S/HPI Vicki Hull is a 33 y.o. [id number] patient who presents to MAU today with complaint of heavy vaginal bleeding. Reports she soaked 5-6 pads in the past 24 hours and had severe cramping and pain last night b/w 7-10 pm. She reports she was unable to come to the hospital last night since she did not have transportation. Today she is reporting that the cramping has subsided and bleeding is less. Her last ultrasound was performed on 12/4 which showed an SIUP with cardiac activity measuring ~ [redacted]w[redacted]d  The remainder  of the ROS is negative unless noted in HPI above    O BP 117/75   Pulse 89   Temp 98.8 F (37.1 C)   Resp 16   Ht 5' 2 (1.575 m)   Wt 76.2 kg   LMP 08/01/2024 (Approximate)   SpO2 99%   BMI 30.73 kg/m  Physical Exam Vitals and nursing note reviewed. Exam conducted with a chaperone present.  Constitutional:      General: She is not in acute distress.    Appearance: Normal appearance. She is not ill-appearing.  HENT:     Head: Normocephalic.  Cardiovascular:     Rate and Rhythm: Normal rate.     Pulses: Normal pulses.  Pulmonary:     Effort: Pulmonary effort is normal.  Abdominal:     Palpations: Abdomen is soft.     Tenderness: There is no abdominal tenderness.  Musculoskeletal:        General: Normal range of motion.     Cervical back: Normal range of motion.  Skin:    General: Skin is warm.  Neurological:     Mental Status: She is alert and oriented to person, place, and time.  Psychiatric:        Mood and Affect: Mood normal.        Behavior: Behavior normal.   Pelvic exam chaperoned by Bobbette Blumenthal RN Pelvic: Speculum exam: Brownish blood in the vault, no pooling, no active Red vaginal bleeding, cervical OS visually open   MDM  HIGH  Vaginal bleeding in early pregnacy CBC: NM CMP: pending at discharge ( Patient states her prior N/V has been controlled) Will contact pt if any abnormal results  OB Ultrasound ( SIUP visualized with FCA at 122  bpm, [facility] also seen today) Wet Prep from 12/5 ( BV) Plan for RX at discharge GC/Chlamydia 12/4: Negative  Differential diagnosis considered for 1st trimester vaginal bleeding includes but is not limited to: ectopic pregnancy, complete spontaneous abortion, incomplete abortion, missed abortion, threatened abortion, embryonic/fetal demise, cervical insufficiency, cervical or vaginal disorder    Orders Placed This Encounter  Procedures   US  OB Transvaginal    Standing Status:   Standing    Number of Occurrences:   1    Symptom/Reason for Exam:   Miscarriage [[id number]]    Symptom/Reason for Exam:   Vaginal bleeding [[id number]]   CBC    Standing Status:   Standing    Number of Occurrences:   1   hCG, quantitative, pregnancy    Standing Status:   Standing    Number of Occurrences:   1   Comprehensive metabolic panel    Standing Status:   Standing    Number of Occurrences:   1   Discharge patient Discharge disposition: 01-Home or Self Care; Discharge patient date: 09/21/2024    Standing Status:   Standing    Number of Occurrences:   1    Discharge disposition:  01-Home or Self Care [1]    Discharge patient date:   09/21/2024      Results for orders placed or performed during the hospital encounter of 09/21/24 (from the past 24 hours)  CBC     Status: Abnormal   Collection Time: 09/21/24 11:21 AM  Result Value Ref Range   WBC 6.3 4.0 - 10.5 K/uL   RBC 3.96 3.87 - 5.11 MIL/uL   Hemoglobin 13.5 12.0 - 15.0 g/dL   HCT 62.9 63.9 - 53.9 %   MCV 93.4 80.0 - 100.0 fL   MCH 34.1 (H) 26.0 - 34.0 pg   MCHC 36.5 (H) 30.0 - 36.0 g/dL   RDW 88.7 (L) 88.4 - 84.4 %   Platelets 291 150 - 400 K/uL   nRBC 0.0 0.0 - 0.2 %       I have reviewed the patient chart and performed the physical exam . I have ordered & interpreted the lab results and reviewed and interpreted the Ultrasound images independently  Medications ordered as stated below.  A/P as described below.  Counseling and education provided  and patient agreeable  with plan as described below. Verbalized understanding.    ASSESSMENT Medical screening exam complete Vaginal bleeding affecting early pregnancy  Subchorionic hematoma in first trimester, single or unspecified fetus  Bacterial vaginosis  Normal intrauterine pregnancy on prenatal ultrasound in first trimester    PLAN  Establish Prenatal Care   Discharge from MAU in stable   See AVS for full description of educational information and instructions provided to the patient at time of discharge  Warning signs for worsening condition that would warrant emergency follow-up discussed  Patient may return to MAU as needed   Littie Olam LABOR, NP 09/21/2024 12:31 PM   Allergies as of 09/21/2024   No Known Allergies      Medication List     TAKE these medications    Doxylamine -Pyridoxine  10-10 MG Tbec Commonly known as: Diclegis  Take 2 tablets by mouth at bedtime.   metoCLOPramide  10 MG tablet Commonly known as: REGLAN  Take 1 tablet (10 mg total) by mouth 3 (three) times daily before meals.   metroNIDAZOLE  500 MG tablet Commonly known as: FLAGYL  Take 1 tablet (500 mg total) by mouth 2 (two) times daily.   ondansetron  4 MG disintegrating tablet Commonly known as: ZOFRAN -ODT Take 1 tablet (4 mg total) by mouth every 6 (six) hours as needed for nausea.   Prenatal 28-0.8 MG Tabs Take 1 tablet by mouth daily.   promethazine  25 MG tablet Commonly known as: PHENERGAN  Take 1 tablet (25 mg total) by mouth every 6 (six) hours as needed.   scopolamine  1 MG/3DAYS Commonly known as: TRANSDERM-SCOP Place 1 patch (1 mg total) onto the skin every 3 (three) days.         This chart was dictated using voice recognition software, Dragon. Despite the best efforts of this provider to proofread and correct errors, errors may still occur which can change documentation meaning.

## 2024-09-21 NOTE — MAU Note (Signed)
 Vicki Hull is a 33 y.o. at [redacted]w[redacted]d here in MAU reporting: Heavy bleeding that started yesterday, has seen blood clots and was soaking pads last night. Lower abdominal pain that has been constant since 7PM last night.  LMP: 08/01/2024 Onset of complaint: 09/20/24 Pain score: 9/10 There were no vitals filed for this visit.
